# Patient Record
Sex: Female | Born: 1965 | Race: White | Hispanic: No | Marital: Married | Smoking: Former smoker
Health system: Southern US, Community
[De-identification: ages and names within clinical notes are randomized; demographics above are authoritative.]

## PROBLEM LIST (undated history)

## (undated) DIAGNOSIS — T7840XA Allergy, unspecified, initial encounter: Secondary | ICD-10-CM

## (undated) DIAGNOSIS — J45909 Unspecified asthma, uncomplicated: Secondary | ICD-10-CM

## (undated) DIAGNOSIS — Z9289 Personal history of other medical treatment: Secondary | ICD-10-CM

## (undated) DIAGNOSIS — Z9889 Other specified postprocedural states: Secondary | ICD-10-CM

## (undated) HISTORY — DX: Personal history of other medical treatment: Z92.89

## (undated) HISTORY — DX: Other specified postprocedural states: Z98.890

## (undated) HISTORY — DX: Unspecified asthma, uncomplicated: J45.909

## (undated) HISTORY — DX: Allergy, unspecified, initial encounter: T78.40XA

---

## 2000-09-03 ENCOUNTER — Emergency Department (HOSPITAL_COMMUNITY): Admission: EM | Admit: 2000-09-03 | Discharge: 2000-09-03 | Payer: Self-pay | Admitting: Internal Medicine

## 2012-01-31 ENCOUNTER — Other Ambulatory Visit: Payer: Self-pay | Admitting: Internal Medicine

## 2012-03-04 ENCOUNTER — Other Ambulatory Visit: Payer: Self-pay | Admitting: Physician Assistant

## 2012-04-22 ENCOUNTER — Other Ambulatory Visit: Payer: Self-pay | Admitting: Physician Assistant

## 2012-05-15 ENCOUNTER — Other Ambulatory Visit: Payer: Self-pay | Admitting: Physician Assistant

## 2012-05-17 ENCOUNTER — Ambulatory Visit: Payer: Managed Care, Other (non HMO) | Admitting: Emergency Medicine

## 2012-05-17 VITALS — BP 133/76 | HR 85 | Temp 98.3°F | Resp 18 | Ht 63.0 in | Wt 174.0 lb

## 2012-05-17 DIAGNOSIS — J45909 Unspecified asthma, uncomplicated: Secondary | ICD-10-CM

## 2012-05-17 MED ORDER — ALBUTEROL SULFATE HFA 108 (90 BASE) MCG/ACT IN AERS: 2.0000 | INHALATION_SPRAY | RESPIRATORY_TRACT | Status: DC | PRN

## 2012-05-17 MED ORDER — MOMETASONE FURO-FORMOTEROL FUM 200-5 MCG/ACT IN AERO: 2.0000 | INHALATION_SPRAY | Freq: Two times a day (BID) | RESPIRATORY_TRACT | Status: DC

## 2012-05-17 NOTE — Progress Notes (Signed)
     Patient Name: Sherry Wheeler Date of Birth: 1965-12-30 Medical Record Number: [id number] Gender: female Date of Encounter: 05/17/2012  Chief Complaint: Asthma   History of Present Illness:  Sherry Wheeler is a 46 y.o. very pleasant female patient who presents with the following:  History of asthma currrently under control.  Out of MDI's no complaints  There is no problem list on file for this patient.  Past Medical History  Diagnosis Date  . Allergy   . Asthma    Past Surgical History  Procedure Date  . Cesarean section    History  Substance Use Topics  . Smoking status: Never Smoker   . Smokeless tobacco: Not on file  . Alcohol Use: Yes     social   Family History  Problem Relation Age of Onset  . Hypertension Father   . Diabetes Paternal Grandmother   . Heart disease Paternal Grandmother   . Cancer Paternal Grandfather    Allergies  Allergen Reactions  . Zoloft (Sertraline Hcl) Rash    Medication list has been reviewed and updated.  Current Outpatient Prescriptions on File Prior to Visit  Medication Sig Dispense Refill  . VENTOLIN HFA 108 (90 BASE) MCG/ACT inhaler INHALE TWO PUFFS EVERY 4 HOURS AS NEEDED FOR SHORTNESS OF BREATH. NEED OFFICE VISIT  18 g  0    Review of Systems:  As per HPI, otherwise negative.    Physical Examination: Filed Vitals:   05/17/12 1310  BP: 133/76  Pulse: 85  Temp: 98.3 F (36.8 C)  Resp: 18   Filed Vitals:   05/17/12 1310  Height: 5\' 3"  (1.6 m)  Weight: 174 lb (78.926 kg)   Body mass index is 30.82 kg/(m^2). Ideal Body Weight: Weight in (lb) to have BMI = 25: 140.8    GEN: WDWN, NAD, Non-toxic, Alert & Oriented x 3 HEENT: Atraumatic, Normocephalic.  Ears and Nose: No external deformity. EXTR: No clubbing/cyanosis/edema NEURO: Normal gait.  PSYCH: Normally interactive. Conversant. Not depressed or anxious appearing.  Calm demeanor.    EKG / Labs / Xrays: None available at time of  encounter  Assessment and Plan: Asthma Refill MDI   Carmelina Dane, MD

## 2012-09-04 ENCOUNTER — Ambulatory Visit (INDEPENDENT_AMBULATORY_CARE_PROVIDER_SITE_OTHER): Payer: Managed Care, Other (non HMO) | Admitting: Physician Assistant

## 2012-09-04 VITALS — BP 132/86 | HR 101 | Temp 99.2°F | Resp 20 | Ht 62.0 in | Wt 179.0 lb

## 2012-09-04 DIAGNOSIS — N898 Other specified noninflammatory disorders of vagina: Secondary | ICD-10-CM

## 2012-09-04 DIAGNOSIS — J45909 Unspecified asthma, uncomplicated: Secondary | ICD-10-CM | POA: Insufficient documentation

## 2012-09-04 DIAGNOSIS — L293 Anogenital pruritus, unspecified: Secondary | ICD-10-CM

## 2012-09-04 DIAGNOSIS — J309 Allergic rhinitis, unspecified: Secondary | ICD-10-CM | POA: Insufficient documentation

## 2012-09-04 LAB — POCT WET PREP WITH KOH: KOH Prep POC: NEGATIVE

## 2012-09-04 MED ORDER — METRONIDAZOLE 500 MG PO TABS: 500.0000 mg | ORAL_TABLET | Freq: Two times a day (BID) | ORAL | Status: DC

## 2012-09-04 NOTE — Patient Instructions (Signed)
Use Claritin or Zyrtec each day for itching.  You may supplement with Benadryl if needed at bedtime.

## 2012-09-04 NOTE — Progress Notes (Signed)
Subjective:    Patient ID: Sherry Wheeler, female    DOB: [birth year], 46 y.o.   MRN: [id number]  HPI This 46 y.o. female presents for evaluation of vaginal irritation x 7-10 days.  Doesn't feel like previous UTI.  Itching and burning.  No change in soaps, etc.  No unusual vaginal discharge.  No urinary symptoms.  No fever, chills.  No nausea, vomiting or diarrhea. Not sexually active.  Review of Systems As above.  Past Medical History  Diagnosis Date  . Allergy   . Asthma     Past Surgical History  Procedure Date  . Cesarean section     Prior to Admission medications   Medication Sig Start Date End Date Taking? Authorizing Provider  albuterol (VENTOLIN HFA) 108 (90 BASE) MCG/ACT inhaler Inhale 2 puffs into the lungs every 4 (four) hours as needed for wheezing. 05/17/12  Yes Phillips Odor, MD  Mometasone Furo-Formoterol Fum 200-5 MCG/ACT AERO Inhale 2 puffs into the lungs 2 (two) times daily. 05/17/12  Yes Phillips Odor, MD    Allergies  Allergen Reactions  . Zoloft (Sertraline Hcl) Rash    History   Social History  . Marital Status: Married    Spouse Name: Rich    Number of Children: 1  . Years of Education: 14+   Occupational History  . waitress     [employer]   Social History Main Topics  . Smoking status: Current Every Day Smoker -- 1.0 packs/day    Types: Cigarettes  . Smokeless tobacco: Never Used  . Alcohol Use: Yes     social-rare  . Drug Use: No  . Sexually Active: Not Currently   Other Topics Concern  . Not on file   Social History Narrative  . No narrative on file    Family History  Problem Relation Age of Onset  . Hypertension Father   . Diabetes Paternal Grandmother   . Heart disease Paternal Grandmother   . Cancer Paternal Grandfather        Objective:   Physical Exam  Vitals reviewed. Constitutional: She is oriented to person, place, and time. She appears well-developed and well-nourished. No distress.  Eyes: Conjunctivae  normal are normal.  Pulmonary/Chest: Effort normal.  Genitourinary: Uterus normal. Rectal exam shows no external hemorrhoid and no fissure. Pelvic exam was performed with patient supine. No labial fusion. There is no rash, tenderness, lesion or injury on the right labia. There is no rash, tenderness, lesion or injury on the left labia. Cervix exhibits no motion tenderness, no discharge and no friability. Right adnexum displays no mass, no tenderness and no fullness. Left adnexum displays no mass, no tenderness and no fullness. No erythema, tenderness or bleeding around the vagina. No foreign body around the vagina. No signs of injury around the vagina. Vaginal discharge found.  Neurological: She is alert and oriented to person, place, and time.  Skin: Skin is warm and dry.  Psychiatric: She has a normal mood and affect.   Results for orders placed in visit on 09/04/12  POCT WET PREP WITH KOH      Component Value Range   Trichomonas, UA Negative     Clue Cells Wet Prep HPF POC 2-3     Epithelial Wet Prep HPF POC 0-1     Yeast Wet Prep HPF POC neg     Bacteria Wet Prep HPF POC 5+     RBC Wet Prep HPF POC 4-6     WBC Wet Prep HPF  POC 0-1     KOH Prep POC Negative        Assessment & Plan:   1. Leukorrhea, not specified as infective  POCT Wet Prep with KOH, metroNIDAZOLE (FLAGYL) 500 MG tablet  2. Vaginal itching

## 2013-03-13 ENCOUNTER — Other Ambulatory Visit: Payer: Self-pay | Admitting: Emergency Medicine

## 2013-04-27 ENCOUNTER — Other Ambulatory Visit: Payer: Self-pay

## 2013-04-27 MED ORDER — ALBUTEROL SULFATE HFA 108 (90 BASE) MCG/ACT IN AERS: 2.0000 | INHALATION_SPRAY | RESPIRATORY_TRACT | Status: DC | PRN

## 2013-06-03 ENCOUNTER — Telehealth: Payer: Self-pay

## 2013-06-03 NOTE — Telephone Encounter (Signed)
Patient called needing refill on all meds. Please let pt know when done [phone]

## 2013-06-03 NOTE — Telephone Encounter (Signed)
She will need to come in 

## 2013-06-04 MED ORDER — MOMETASONE FURO-FORMOTEROL FUM 200-5 MCG/ACT IN AERO: 2.0000 | INHALATION_SPRAY | Freq: Two times a day (BID) | RESPIRATORY_TRACT | Status: DC

## 2013-06-04 MED ORDER — ALBUTEROL SULFATE HFA 108 (90 BASE) MCG/ACT IN AERS: 2.0000 | INHALATION_SPRAY | RESPIRATORY_TRACT | Status: DC | PRN

## 2013-06-04 NOTE — Telephone Encounter (Signed)
Called her to advise. She wants Dulera and Ventolin inhaler, please advise. She states she can come in Monday. Would like Rx before then,

## 2013-06-04 NOTE — Telephone Encounter (Signed)
Sent to pharmacy.  Follow up as planned.  No further RFs until OV

## 2013-06-04 NOTE — Telephone Encounter (Signed)
Thanks patient advised.  

## 2013-06-11 ENCOUNTER — Other Ambulatory Visit: Payer: Self-pay | Admitting: Radiology

## 2013-06-21 ENCOUNTER — Ambulatory Visit (INDEPENDENT_AMBULATORY_CARE_PROVIDER_SITE_OTHER): Payer: Managed Care, Other (non HMO) | Admitting: Family Medicine

## 2013-06-21 VITALS — BP 142/84 | HR 79 | Temp 98.4°F | Resp 18 | Ht 62.5 in | Wt 190.0 lb

## 2013-06-21 DIAGNOSIS — J45909 Unspecified asthma, uncomplicated: Secondary | ICD-10-CM

## 2013-06-21 DIAGNOSIS — Z720 Tobacco use: Secondary | ICD-10-CM

## 2013-06-21 DIAGNOSIS — F17200 Nicotine dependence, unspecified, uncomplicated: Secondary | ICD-10-CM

## 2013-06-21 DIAGNOSIS — J4540 Moderate persistent asthma, uncomplicated: Secondary | ICD-10-CM

## 2013-06-21 MED ORDER — MONTELUKAST SODIUM 10 MG PO TABS: 10.0000 mg | ORAL_TABLET | Freq: Every day | ORAL | Status: DC

## 2013-06-21 MED ORDER — FLUTICASONE-SALMETEROL 100-50 MCG/DOSE IN AEPB: 1.0000 | INHALATION_SPRAY | Freq: Two times a day (BID) | RESPIRATORY_TRACT | Status: DC

## 2013-06-21 MED ORDER — ALBUTEROL SULFATE HFA 108 (90 BASE) MCG/ACT IN AERS: 2.0000 | INHALATION_SPRAY | RESPIRATORY_TRACT | Status: DC | PRN

## 2013-06-21 NOTE — Patient Instructions (Addendum)
PLEASE think hard about quitting smoking.  Your asthma will almost certainly get a lot better if you quit.  Right now your peak flows show that you are not under good control.  Start using the advair twice a day- please let me know if not helpful.  Please give me a call with an update in about 2 weeks

## 2013-06-21 NOTE — Progress Notes (Signed)
[facility] [address] [place] [address] [phone]  Date:  06/21/2013   Name:  JAZZMYN FILION   DOB:  1966-09-02   MRN:  [id number]  PCP:  Tally Due, MD    Chief Complaint: Asthma   History of Present Illness:  REANA CHACKO is a 47 y.o. very pleasant female patient who presents with the following:  She needs a refill of her asthma medications. Last seen here for asthma about one year ago She uses albuterol, and has been using dulera most recently.  However, she did better with advair in the past and would like to change back.  She needs her albuterol sometimes every 4 hours- this has been the case for several months.   She is not sure how long she has been on the dulera.  She has been on sigulair in the past as well and did ok with it.   She has been in the ED, but never admitted with her asthma. Some cough.   She is a smoker- currently smokes about 1 PPD.  She would like to quit but is not ready now because she is afraid she will gain weight.    Peak flow = 250, predicted 500 LMP about 2 months ago.  She is sometimes sexually active but "not often" Used albuterol and went to 275  Patient Active Problem List   Diagnosis Date Noted  . Asthma 09/04/2012  . AR (allergic rhinitis) 09/04/2012    Past Medical History  Diagnosis Date  . Allergy   . Asthma     Past Surgical History  Procedure Laterality Date  . Cesarean section      History  Substance Use Topics  . Smoking status: Current Every Day Smoker -- 1.00 packs/day    Types: Cigarettes  . Smokeless tobacco: Never Used  . Alcohol Use: Yes     Comment: social-rare    Family History  Problem Relation Age of Onset  . Hypertension Father   . Diabetes Paternal Grandmother   . Heart disease Paternal Grandmother   . Cancer Paternal Grandfather     Allergies  Allergen Reactions  . Zoloft (Sertraline Hcl) Rash    Medication list has been reviewed and  updated.  Current Outpatient Prescriptions on File Prior to Visit  Medication Sig Dispense Refill  . albuterol (VENTOLIN HFA) 108 (90 BASE) MCG/ACT inhaler Inhale 2 puffs into the lungs every 4 (four) hours as needed for wheezing. PATIENT NEEDS OFFICE VISIT FOR ADDITIONAL REFILLS  18 each  0  . mometasone-formoterol (DULERA) 200-5 MCG/ACT AERO Inhale 2 puffs into the lungs 2 (two) times daily.  1 Inhaler  0  . metroNIDAZOLE (FLAGYL) 500 MG tablet Take 1 tablet (500 mg total) by mouth 2 (two) times daily with a meal. DO NOT CONSUME ALCOHOL WHILE TAKING THIS MEDICATION.  14 tablet  0   No current facility-administered medications on file prior to visit.    Review of Systems:  As per HPI- otherwise negative.   Physical Examination: Filed Vitals:   06/21/13 1447  BP: 142/84  Pulse: 79  Temp: 98.4 F (36.9 C)  Resp: 18   Filed Vitals:   06/21/13 1447  Height: 5' 2.5" (1.588 m)  Weight: 190 lb (86.183 kg)   Body mass index is 34.18 kg/(m^2). Ideal Body Weight: Weight in (lb) to have BMI = 25: 138.6  GEN: WDWN, NAD, Non-toxic, A & O x 3, overweight HEENT: Atraumatic, Normocephalic. Neck  supple. No masses, No LAD.  Bilateral TM wnl, oropharynx normal.  PEERL,EOMI.   Ears and Nose: No external deformity. CV: RRR, No M/G/R. No JVD. No thrill. No extra heart sounds. PULM: CTA B, no wheezes, crackles, rhonchi. No retractions. No resp. distress. No accessory muscle use. ABD: S, NT, ND, +BS. No rebound. No HSM. EXTR: No c/c/e NEURO Normal gait.  PSYCH: Normally interactive. Conversant. Not depressed or anxious appearing.  Calm demeanor.    Assessment and Plan: Asthma, chronic, moderate persistent, uncomplicated - Plan: albuterol (VENTOLIN HFA) 108 (90 BASE) MCG/ACT inhaler, Fluticasone-Salmeterol (ADVAIR) 100-50 MCG/DOSE AEPB, montelukast (SINGULAIR) 10 MG tablet  Tobacco abuse  Changed to advair since dulera has not seemed to help her as much.  Also gave a paper rx for singulair-  she will need to see how much this costs prior to filling it.   Recommended that we do a urine pregnancy test to ensure she is not pregnant. Pt refuses to do HCG test, states that she knows she is not pregnant as she has had a period since her last act of intercourse.   See patient instructions for more details.    Signed Abbe Amsterdam, MD

## 2013-07-06 ENCOUNTER — Telehealth: Payer: Self-pay

## 2013-07-06 NOTE — Telephone Encounter (Signed)
Dr.Copland, Pt is calling to let you know that the advair has improved her asthma and she is now only having to use her rescue inhaler twice a day opposed to every four hours like she used to. Best# [phone]

## 2013-07-08 NOTE — Telephone Encounter (Signed)
Called and LMOM.  thanks for the update.  This is better, but we want her using her albuterol even less.  Please continue to working on quitting smoking.  Please come and see me in September- we may need to increase her adviar dosage for better control

## 2013-08-02 ENCOUNTER — Telehealth: Payer: Self-pay

## 2013-08-02 DIAGNOSIS — J4540 Moderate persistent asthma, uncomplicated: Secondary | ICD-10-CM

## 2013-08-02 MED ORDER — FLUTICASONE-SALMETEROL 250-50 MCG/DOSE IN AEPB: 1.0000 | INHALATION_SPRAY | Freq: Two times a day (BID) | RESPIRATORY_TRACT | Status: DC

## 2013-08-02 NOTE — Telephone Encounter (Signed)
PT STATES THE DR HAD UP HER DOSAGE OF ADVAIR AND NOW SHE IS OUT OF HER MEDICINE. PLEASE CALL [phone]    [facility] [address]

## 2013-08-02 NOTE — Telephone Encounter (Signed)
Please call this patient.  Looks like there was some miscommunication.    When she was seen 06/21/2013, she was told to come back in 2 weeks for re-check.  She called on 07/06/13 and said she was doing better, and Dr. Patsy Lager said to come back in so we could increase the Advair dosage.  Advair is not a medication that should be increased from 1 puff twice daily to 2 puffs twice daily, because it is a combination of 2 medications.  When we increase the dose, we give a new prescription, to increase the dose of one of the components, but not both.  Please advise the patient to RTC.  Since she's out, I'm sending in 1 diskus.  She needs to come in before she runs out.

## 2013-08-02 NOTE — Telephone Encounter (Signed)
Patient advised.

## 2013-08-03 ENCOUNTER — Telehealth: Payer: Self-pay

## 2013-08-03 NOTE — Telephone Encounter (Signed)
Dr Patsy Lager, pharm faxed req for clarification as to sig for pt's Advair. The current sig is inhale 1 dose into the lungs BID, but pt reported to them that she was told to use more than BID. Please advise and send in new Rx w/correct dose if it needs to be increased.

## 2013-08-03 NOTE — Telephone Encounter (Signed)
Called and [facility] for pt to clarify.  The strength of her advair has been increased so she only needs to use it one puff BID

## 2013-08-30 ENCOUNTER — Ambulatory Visit: Payer: Managed Care, Other (non HMO) | Admitting: Family Medicine

## 2013-08-30 VITALS — BP 152/96 | HR 87 | Temp 98.6°F | Resp 16 | Ht 62.0 in | Wt 184.0 lb

## 2013-08-30 DIAGNOSIS — F17200 Nicotine dependence, unspecified, uncomplicated: Secondary | ICD-10-CM

## 2013-08-30 DIAGNOSIS — J4540 Moderate persistent asthma, uncomplicated: Secondary | ICD-10-CM

## 2013-08-30 DIAGNOSIS — J45909 Unspecified asthma, uncomplicated: Secondary | ICD-10-CM

## 2013-08-30 MED ORDER — ALBUTEROL SULFATE HFA 108 (90 BASE) MCG/ACT IN AERS: 2.0000 | INHALATION_SPRAY | RESPIRATORY_TRACT | Status: DC | PRN

## 2013-08-30 MED ORDER — FLUTICASONE PROPIONATE 50 MCG/ACT NA SUSP: 2.0000 | Freq: Every day | NASAL | Status: DC

## 2013-08-30 MED ORDER — MONTELUKAST SODIUM 10 MG PO TABS: 10.0000 mg | ORAL_TABLET | Freq: Every day | ORAL | Status: DC

## 2013-08-30 MED ORDER — CETIRIZINE HCL 10 MG PO TABS: 10.0000 mg | ORAL_TABLET | Freq: Every day | ORAL | Status: DC

## 2013-08-30 MED ORDER — FLUTICASONE-SALMETEROL 250-50 MCG/DOSE IN AEPB: 1.0000 | INHALATION_SPRAY | Freq: Two times a day (BID) | RESPIRATORY_TRACT | Status: DC

## 2013-08-30 NOTE — Patient Instructions (Addendum)
Your ideal or goal peak flow should be 460. Last time you were in, when you were on the [facility] you were at 250 and up to 275 after albuterol.  Today on the Advair you are at.  After allergy season resolves and you are at your baseline, it may be worth coming in for full spirometry testing to make sure your lungs are functioning at the best possible level.  Allergic Rhinitis Allergic rhinitis is when the mucous membranes in the nose respond to allergens. Allergens are particles in the air that cause your body to have an allergic reaction. This causes you to release allergic antibodies. Through a chain of events, these eventually cause you to release histamine into the blood stream (hence the use of antihistamines). Although meant to be protective to the body, it is this release that causes your discomfort, such as frequent sneezing, congestion and an itchy runny nose.  CAUSES  The pollen allergens may come from grasses, trees, and weeds. This is seasonal allergic rhinitis, or "hay fever." Other allergens cause year-round allergic rhinitis (perennial allergic rhinitis) such as house dust mite allergen, pet dander and mold spores.  SYMPTOMS   Nasal stuffiness (congestion).  Runny, itchy nose with sneezing and tearing of the eyes.  There is often an itching of the mouth, eyes and ears. It cannot be cured, but it can be controlled with medications. DIAGNOSIS  If you are unable to determine the offending allergen, skin or blood testing may find it. TREATMENT   Avoid the allergen.  Medications and allergy shots (immunotherapy) can help.  Hay fever may often be treated with antihistamines in pill or nasal spray forms. Antihistamines block the effects of histamine. There are over-the-counter medicines that may help with nasal congestion and swelling around the eyes. Check with your caregiver before taking or giving this medicine. If the treatment above does not work, there are many new medications your  caregiver can prescribe. Stronger medications may be used if initial measures are ineffective. Desensitizing injections can be used if medications and avoidance fails. Desensitization is when a patient is given ongoing shots until the body becomes less sensitive to the allergen. Make sure you follow up with your caregiver if problems continue. SEEK MEDICAL CARE IF:   You develop fever (more than 100.5 F (38.1 C).  You develop a cough that does not stop easily (persistent).  You have shortness of breath.  You start wheezing.  Symptoms interfere with normal daily activities. Document Released: 07/30/2001 Document Revised: 01/27/2012 Document Reviewed: 02/08/2009 [facility] Patient Information 2014 [place], [place].  Hay Fever Hay fever is an allergic reaction to particles in the air. It cannot be passed from person to person. It cannot be cured, but it can be controlled. CAUSES  Hay fever is caused by something that triggers an allergic reaction (allergens). The following are examples of allergens:  Ragweed.  Feathers.  Animal dander.  Grass and tree pollens.  Cigarette smoke.  House dust.  Pollution. SYMPTOMS   Sneezing.  Runny or stuffy nose.  Tearing eyes.  Itchy eyes, nose, mouth, throat, skin, or other area.  Sore throat.  Headache.  Decreased sense of smell or taste. DIAGNOSIS Your caregiver will perform a physical exam and ask questions about the symptoms you are having.Allergy testing may be done to determine exactly what triggers your hay fever.  TREATMENT   Over-the-counter medicines may help symptoms. These include:  Antihistamines.  Decongestants. These may help with nasal congestion.  Your caregiver may prescribe  medicines if over-the-counter medicines do not work.  Some people benefit from allergy shots when other medicines are not helpful. HOME CARE INSTRUCTIONS   Avoid the allergen that is causing your symptoms, if possible.  Take all  medicine as told by your caregiver. SEEK MEDICAL CARE IF:   You have severe allergy symptoms and your current medicines are not helping.  Your treatment was working at one time, but you are now experiencing symptoms.  You have sinus congestion and pressure.  You develop a fever or headache.  You have thick nasal discharge.  You have asthma and have a worsening cough and wheezing. SEEK IMMEDIATE MEDICAL CARE IF:   You have swelling of your tongue or lips.  You have trouble breathing.  You feel lightheaded or like you are going to faint.  You have cold sweats.  You have a fever. Document Released: 11/04/2005 Document Revised: 01/27/2012 Document Reviewed: 01/30/2011 [facility] Patient Information 2014 [place], [place]. Smoking Cessation, Tips for Success YOU CAN QUIT SMOKING If you are ready to quit smoking, congratulations! You have chosen to help yourself be healthier. Cigarettes bring nicotine, tar, carbon monoxide, and other irritants into your body. Your lungs, heart, and blood vessels will be able to work better without these poisons. There are many different ways to quit smoking. Nicotine gum, nicotine patches, a nicotine inhaler, or nicotine nasal spray can help with physical craving. Hypnosis, support groups, and medicines help break the habit of smoking. Here are some tips to help you quit for good.  Throw away all cigarettes.  Clean and remove all ashtrays from your home, work, and car.  On a card, write down your reasons for quitting. Carry the card with you and read it when you get the urge to smoke.  Cleanse your body of nicotine. Drink enough water and fluids to keep your urine clear or pale yellow. Do this after quitting to flush the nicotine from your body.  Learn to predict your moods. Do not let a bad situation be your excuse to have a cigarette. Some situations in your life might tempt you into wanting a cigarette.  Never have "just one" cigarette. It leads to  wanting another and another. Remind yourself of your decision to quit.  Change habits associated with smoking. If you smoked while driving or when feeling stressed, try other activities to replace smoking. Stand up when drinking your coffee. Brush your teeth after eating. Sit in a different chair when you read the paper. Avoid alcohol while trying to quit, and try to drink fewer caffeinated beverages. Alcohol and caffeine may urge you to smoke.  Avoid foods and drinks that can trigger a desire to smoke, such as sugary or spicy foods and alcohol.  Ask people who smoke not to smoke around you.  Have something planned to do right after eating or having a cup of coffee. Take a walk or exercise to perk you up. This will help to keep you from overeating.  Try a relaxation exercise to calm you down and decrease your stress. Remember, you may be tense and nervous for the first 2 weeks after you quit, but this will pass.  Find new activities to keep your hands busy. Play with a pen, coin, or rubber band. Doodle or draw things on paper.  Brush your teeth right after eating. This will help cut down on the craving for the taste of tobacco after meals. You can try mouthwash, too.  Use oral substitutes, such as lemon drops,  carrots, a cinnamon stick, or chewing gum, in place of cigarettes. Keep them handy so they are available when you have the urge to smoke.  When you have the urge to smoke, try deep breathing.  Designate your home as a nonsmoking area.  If you are a heavy smoker, ask your caregiver about a prescription for nicotine chewing gum. It can ease your withdrawal from nicotine.  Reward yourself. Set aside the cigarette money you save and buy yourself something nice.  Look for support from others. Join a support group or smoking cessation program. Ask someone at home or at work to help you with your plan to quit smoking.  Always ask yourself, "Do I need this cigarette or is this just a  reflex?" Tell yourself, "Today, I choose not to smoke," or "I do not want to smoke." You are reminding yourself of your decision to quit, even if you do smoke a cigarette. HOW WILL I FEEL WHEN I QUIT SMOKING?  The benefits of not smoking start within days of quitting.  You may have symptoms of withdrawal because your body is used to nicotine (the addictive substance in cigarettes). You may crave cigarettes, be irritable, feel very hungry, cough often, get headaches, or have difficulty concentrating.  The withdrawal symptoms are only temporary. They are strongest when you first quit but will go away within 10 to 14 days.  When withdrawal symptoms occur, stay in control. Think about your reasons for quitting. Remind yourself that these are signs that your body is healing and getting used to being without cigarettes.  Remember that withdrawal symptoms are easier to treat than the major diseases that smoking can cause.  Even after the withdrawal is over, expect periodic urges to smoke. However, these cravings are generally short-lived and will go away whether you smoke or not. Do not smoke!  If you relapse and smoke again, do not lose hope. Most smokers quit 3 times before they are successful.  If you relapse, do not give up! Plan ahead and think about what you will do the next time you get the urge to smoke. LIFE AS A NONSMOKER: MAKE IT FOR A MONTH, MAKE IT FOR LIFE Day 1: Hang this page where you will see it every day. Day 2: Get rid of all ashtrays, matches, and lighters. Day 3: Drink water. Breathe deeply between sips. Day 4: Avoid places with smoke-filled air, such as bars, clubs, or the smoking section of restaurants. Day 5: Keep track of how much money you save by not smoking. Day 6: Avoid boredom. Keep a good book with you or go to the movies. Day 7: Reward yourself! One week without smoking! Day 8: Make a dental appointment to get your teeth cleaned. Day 9: Decide how you will turn  down a cigarette before it is offered to you. Day 10: Review your reasons for quitting. Day 11: Distract yourself. Stay active to keep your mind off smoking and to relieve tension. Take a walk, exercise, read a book, do a crossword puzzle, or try a new hobby. Day 12: Exercise. Get off the bus before your stop or use stairs instead of escalators. Day 13: Call on friends for support and encouragement. Day 14: Reward yourself! Two weeks without smoking! Day 15: Practice deep breathing exercises. Day 16: Bet a friend that you can stay a nonsmoker. Day 17: Ask to sit in nonsmoking sections of restaurants. Day 18: Hang up "No Smoking" signs. Day 19: Think of yourself as a  nonsmoker. Day 20: Each morning, tell yourself you will not smoke. Day 21: Reward yourself! Three weeks without smoking! Day 22: Think of smoking in negative ways. Remember how it stains your teeth, gives you bad breath, and leaves you short of breath. Day 23: Eat a nutritious breakfast. Day 24:Do not relive your days as a smoker. Day 25: Hold a pencil in your hand when talking on the telephone. Day 26: Tell all your friends you do not smoke. Day 27: Think about how much better food tastes. Day 28: Remember, one cigarette is one too many. Day 29: Take up a hobby that will keep your hands busy. Day 30: Congratulations! One month without smoking! Give yourself a big reward. Your caregiver can direct you to community resources or hospitals for support, which may include:  Group support.  Education.  Hypnosis.  Subliminal therapy. Document Released: 08/02/2004 Document Revised: 01/27/2012 Document Reviewed: 08/21/2009 [facility] Patient Information 2014 [place], [place].

## 2013-08-30 NOTE — Progress Notes (Signed)
Subjective:  This chart was scribed for Sherry Sorenson, MD by Sherry Wheeler, ED scribe.  This patient was seen in room [facility] Room 9 and the patient's care was started at 3:38 PM.   Patient ID: Sherry Wheeler, female    DOB: [birth year], 47 y.o.   MRN: [id number]  Chief Complaint  Patient presents with   Follow-up    asthma    HPI  HPI Comments: Sherry Wheeler is a 47 y.o. female who present for a follow-up appointment in regard to her asthma medication.  Pt was seen on 06/21/13 by Dr. Patsy Wheeler requesting a refill for her asthma medication.  At that time she was using [facility] and was finding that she had to use her albuterol inhaler every 4 hours.  At that visit her peak flow was also found to be 250 and predicted at 500.  She was changed from [facility] to Advair and also had Singulair added.  She states that her asthma has improved significantly since then and presently she is using her albuterol inhaler 1x/day.  She states she generally becomes worse seasonally and she is hoping her symptoms will further improve after the end of autumn.  She does not take any other allergy medications.  Pt denies audible wheezing or waking up short of breath recently.  She denies recent ED visits.    Pt is a current one-pack-per-day smoker.  Pt also notes she has had some mild nasal congestion for the past 2 weeks which she attributes to a mild cold or allergies.  She has been taking Alka Seltzer Plus as needed.    Past Medical History  Diagnosis Date   Allergy    Asthma       Medication List       This list is accurate as of: 08/30/13  3:38 PM.  Always use your most recent med list.               albuterol 108 (90 BASE) MCG/ACT inhaler  Commonly known as:  VENTOLIN HFA  Inhale 2 puffs into the lungs every 4 (four) hours as needed for wheezing.     Fluticasone-Salmeterol 250-50 MCG/DOSE Aepb  Commonly known as:  ADVAIR  Inhale 1 puff into the lungs 2 (two) times daily.     metroNIDAZOLE  500 MG tablet  Commonly known as:  FLAGYL  Take 1 tablet (500 mg total) by mouth 2 (two) times daily with a meal. DO NOT CONSUME ALCOHOL WHILE TAKING THIS MEDICATION.     montelukast 10 MG tablet  Commonly known as:  SINGULAIR  Take 1 tablet (10 mg total) by mouth at bedtime.        Social History Main Topics   Smoking status: Current Every Day Smoker -- 1.00 packs/day    Types: Cigarettes   Smokeless tobacco: Never Used   Alcohol Use: Yes     Comment: social-rare   Drug Use: No   Sexual Activity: Not Currently    Allergies  Allergen Reactions   Zoloft [Sertraline Hcl] Rash     Review of Systems  Constitutional: Negative for fever, chills, diaphoresis, activity change, appetite change, fatigue and unexpected weight change.  HENT: Positive for congestion, postnasal drip and rhinorrhea. Negative for ear discharge, ear pain, sinus pressure and sore throat.   Respiratory: Positive for shortness of breath. Negative for apnea, cough and wheezing.   Allergic/Immunologic: Positive for environmental allergies.  Hematological: Negative for adenopathy.  Psychiatric/Behavioral: Negative for sleep disturbance.  BP 152/96   Pulse 87   Temp(Src) 98.6 F (37 C) (Oral)   Resp 16   Ht 5\' 2"  (1.575 m)   Wt 184 lb (83.462 kg)   BMI 33.65 kg/m2   SpO2 98%   LMP 07/20/2013   Objective:   Physical Exam  Nursing note and vitals reviewed. Constitutional: She is oriented to person, place, and time. She appears well-developed and well-nourished. No distress.  HENT:  Head: Normocephalic and atraumatic.  Right Ear: Tympanic membrane and ear canal normal.  Left Ear: Tympanic membrane and ear canal normal.  Nose: Mucosal edema present. No rhinorrhea.  Mouth/Throat: Posterior oropharyngeal erythema present. No oropharyngeal exudate or posterior oropharyngeal edema.  Eyes: EOM are normal.  Neck: Neck supple. No tracheal deviation present.  Cardiovascular: Normal rate, regular rhythm, S1  normal and S2 normal.   Pulmonary/Chest: Effort normal and breath sounds normal. No respiratory distress. She has no decreased breath sounds. She has no wheezes. She has no rhonchi. She has no rales.  Musculoskeletal: Normal range of motion.  Lymphadenopathy:       Head (right side): No submental, no preauricular and no posterior auricular adenopathy present.       Head (left side): No submental, no preauricular and no posterior auricular adenopathy present.    She has no cervical adenopathy.       Right: No supraclavicular adenopathy present.       Left: No supraclavicular adenopathy present.  Neurological: She is alert and oriented to person, place, and time.  Skin: Skin is warm and dry.  Psychiatric: She has a normal mood and affect. Her behavior is normal.     peak flow 310 - goal peak flow 467 Assessment & Plan:  Tobacco use disorder - Encouraged cessation - discussed wellbutrin but pt declines since did not help husband. Worried about weight gain with cessation.  Asthma, chronic, moderate persistent, uncomplicated - Plan: montelukast (SINGULAIR) 10 MG tablet, Fluticasone-Salmeterol (ADVAIR) 250-50 MCG/DOSE AEPB, albuterol (VENTOLIN HFA) 108 (90 BASE) MCG/ACT inhaler - Continue on current regimen as pt feels large amount of symptomatic improvement. However, peak flow is still sig below goal. Rec recheck NOT during allergy season as suspect allergic rhinitis may currently be making asthma worse. If peak flow continues to be sig below goal at f/u consider increasing advair to 500/50 and full spirometry.  Allergic rhinitis - start zyrtec and flonase  Meds ordered this encounter  Medications   montelukast (SINGULAIR) 10 MG tablet    Sig: Take 1 tablet (10 mg total) by mouth at bedtime.    Dispense:  90 tablet    Refill:  3   cetirizine (ZYRTEC) 10 MG tablet    Sig: Take 1 tablet (10 mg total) by mouth daily.    Dispense:  30 tablet    Refill:  11   fluticasone (FLONASE) 50 MCG/ACT  nasal spray    Sig: Place 2 sprays into the nose daily.    Dispense:  16 g    Refill:  6   Fluticasone-Salmeterol (ADVAIR) 250-50 MCG/DOSE AEPB    Sig: Inhale 1 puff into the lungs 2 (two) times daily.    Dispense:  60 each    Refill:  8    Order Specific Question:  Supervising Provider    Answer:  DOOLITTLE, ROBERT P [[id number]]   albuterol (VENTOLIN HFA) 108 (90 BASE) MCG/ACT inhaler    Sig: Inhale 2 puffs into the lungs every 4 (four) hours as needed for wheezing.  Dispense:  18 each    Refill:  11    Order Specific Question:  Supervising Provider    Answer:  Ethelda Chick [[id number]]  '

## 2014-06-03 ENCOUNTER — Ambulatory Visit (INDEPENDENT_AMBULATORY_CARE_PROVIDER_SITE_OTHER): Payer: Managed Care, Other (non HMO) | Admitting: Family Medicine

## 2014-06-03 VITALS — BP 128/88 | HR 93 | Temp 98.9°F | Resp 18 | Ht 62.0 in | Wt 185.0 lb

## 2014-06-03 DIAGNOSIS — IMO0002 Reserved for concepts with insufficient information to code with codable children: Secondary | ICD-10-CM

## 2014-06-03 DIAGNOSIS — B353 Tinea pedis: Secondary | ICD-10-CM

## 2014-06-03 DIAGNOSIS — F17200 Nicotine dependence, unspecified, uncomplicated: Secondary | ICD-10-CM

## 2014-06-03 DIAGNOSIS — J4540 Moderate persistent asthma, uncomplicated: Secondary | ICD-10-CM

## 2014-06-03 DIAGNOSIS — J45909 Unspecified asthma, uncomplicated: Secondary | ICD-10-CM

## 2014-06-03 DIAGNOSIS — L03012 Cellulitis of left finger: Secondary | ICD-10-CM

## 2014-06-03 MED ORDER — BACITRACIN 500 UNIT/GM EX OINT: 1.0000 "application " | TOPICAL_OINTMENT | Freq: Two times a day (BID) | CUTANEOUS | Status: DC

## 2014-06-03 MED ORDER — CLOTRIMAZOLE 1 % EX CREA: 1.0000 "application " | TOPICAL_CREAM | Freq: Two times a day (BID) | CUTANEOUS | Status: DC

## 2014-06-03 MED ORDER — FLUTICASONE-SALMETEROL 250-50 MCG/DOSE IN AEPB: 1.0000 | INHALATION_SPRAY | Freq: Two times a day (BID) | RESPIRATORY_TRACT | Status: DC

## 2014-06-03 NOTE — Progress Notes (Signed)
Subjective:    Patient ID: Sherry Wheeler, female    DOB: 1966-01-07, 48 y.o.   MRN: [id number]  HPI This is a very pleasant 48 year old female who presents today with several complaints. Patient reports today with left 3rd finger infection around her nail. She had a blister that burst and then it became red. She has been using peroxide and neosporin without improvement. It is mildly painful.   Has noticed athlete's foot at base of right great toe. This is peeling and itchy. Has used OTC antifungal cream for less than a week. No improvement. Had this problem in past and was given a RX medication which cleared it quickly. She can not recall the name.  Patient reports that her asthma is well controlled. Has used her albuterol inhaler 3-4 times in last month. This has correlated with forgetting to use advair at bedtime. She reports that she is not restricted in her activities due to her breathing, but feels that her weight makes it difficult to be as active as she would like.  Patient has stopped smoking cigarettes and is vaping. She has decreased her nicotine use in her vape. She uses a combination of 3 and 6 mg liquid.  [facility], is interested in establishing regular care at 104.    Review of Systems No fever, no chills, no cough, no wheezing, no SOB.    Objective:   Physical Exam  Vitals reviewed. Constitutional: She is oriented to person, place, and time. She appears well-developed and well-nourished.  HENT:  Head: Normocephalic and atraumatic.  Right Ear: External ear normal.  Left Ear: External ear normal.  Nose: Nose normal.  Mouth/Throat: Oropharynx is clear and moist.  Eyes: Conjunctivae are normal.  Neck: Normal range of motion. Neck supple.  Cardiovascular: Normal rate, regular rhythm and normal heart sounds.   Pulmonary/Chest: Effort normal and breath sounds normal. She has no wheezes.  Musculoskeletal: Normal range of motion.    Neurological: She is alert and oriented to person, place, and time.  Skin: Skin is warm and dry.  Left third fingernail base with erythema, moderate swelling, small area intact scabbing. Nail secure. Unable to express drainage. No streaking.  Base of right great toe with approximately 1 cm area of peeling skin. No drainage.   Psychiatric: She has a normal mood and affect. Her behavior is normal. Judgment and thought content normal.    Peak flows- good effort, range of 300-400, below predicted 467, but improved from last visit.     Assessment & Plan:  1. Asthma, chronic, moderate persistent, uncomplicated - Spirometry: Peak - Fluticasone-Salmeterol (ADVAIR) 250-50 MCG/DOSE AEPB; Inhale 1 puff into the lungs 2 (two) times daily.  Dispense: 60 each; Refill: 8  2. Tobacco use disorder -discussed unknown risk factors of vaping and encouraged patient to continue to decrease nicotine with the goal of stopping  3. Tinea pedis of both feet - clotrimazole (LOTRIMIN) 1 % cream; Apply 1 application topically 2 (two) times daily. Apply until rash resolved and then for 5 more days  Dispense: 30 g; Refill: 0  4. Paronychia, left - offered to numb area and open to promote drainage and healing, patient declined. Will conservatively treat with topical antibiotic ointment and warm compresses. Patient understand that it may get worse. Patient leaving to go on vacation in 8 days; encouraged her to return to clinic for treatment if no improvement in 3 days. - bacitracin 500 UNIT/GM ointment; Apply 1  application topically 2 (two) times daily.  Dispense: 15 g; Refill: 0  -Follow up in 3 months at 104 for CPE/follow up of asthma  Emi Belfast, FNP-BC  [facility]  06/04/2014 1:39 PM

## 2014-06-04 ENCOUNTER — Encounter: Payer: Self-pay | Admitting: Family Medicine

## 2014-06-06 NOTE — Progress Notes (Signed)
7/20 tried to call patient but VM was full. Could not leave a message.

## 2014-06-09 ENCOUNTER — Telehealth: Payer: Self-pay | Admitting: Family Medicine

## 2014-06-09 NOTE — Telephone Encounter (Signed)
Pt is going out of town, she will call to set up an appt when she gets back

## 2014-09-19 ENCOUNTER — Encounter: Payer: Self-pay | Admitting: Family Medicine

## 2015-01-30 ENCOUNTER — Other Ambulatory Visit: Payer: Self-pay | Admitting: Family Medicine

## 2015-02-09 ENCOUNTER — Telehealth: Payer: Self-pay

## 2015-02-09 NOTE — Telephone Encounter (Signed)
Looked for coupons. We do not currently have any. Left VM informing pt we do not have coupons.

## 2015-02-09 NOTE — Telephone Encounter (Signed)
We treat the patient for asthma and she is wondering if we have any coupons for the advair inhaler.  [phone]

## 2015-03-06 ENCOUNTER — Ambulatory Visit (INDEPENDENT_AMBULATORY_CARE_PROVIDER_SITE_OTHER): Payer: Managed Care, Other (non HMO) | Admitting: Family Medicine

## 2015-03-06 VITALS — BP 128/80 | HR 90 | Temp 98.1°F | Resp 18 | Ht 62.0 in | Wt 161.0 lb

## 2015-03-06 DIAGNOSIS — L03221 Cellulitis of neck: Secondary | ICD-10-CM | POA: Diagnosis not present

## 2015-03-06 MED ORDER — CEPHALEXIN 500 MG PO CAPS: 500.0000 mg | ORAL_CAPSULE | Freq: Three times a day (TID) | ORAL | Status: DC

## 2015-03-06 NOTE — Patient Instructions (Signed)
I sent an rx for keflex antibiotic to your drug store.  Take this three times a day for 5- 7 days.   If your neck is not getting better or if you are getting worse please let me know or otherwise seek care!

## 2015-03-06 NOTE — Progress Notes (Signed)
[facility] [address], [place] [address] [phone]  Date:  03/06/2015   Name:  Sherry Wheeler   DOB:  12-15-1965   MRN:  [id number]  PCP:  Tally Due, MD    Chief Complaint: Mass   History of Present Illness:  Sherry Wheeler is a 49 y.o. very pleasant female patient who presents with the following:  Generally healthy except for asthma.   She noted a painful lump on the back left of her neck yesterday.  It burns, hurts to the touch. No other spots that she has noticed.   No fever, ow she feels well.  No cough LMP is current No ST or chills  Patient Active Problem List   Diagnosis Date Noted  . Asthma 09/04/2012  . AR (allergic rhinitis) 09/04/2012    Past Medical History  Diagnosis Date  . Allergy   . Asthma     Past Surgical History  Procedure Laterality Date  . Cesarean section      History  Substance Use Topics  . Smoking status: Current Every Day Smoker -- 1.00 packs/day    Types: Cigarettes  . Smokeless tobacco: Never Used  . Alcohol Use: Yes     Comment: social-rare    Family History  Problem Relation Age of Onset  . Hypertension Father   . Diabetes Paternal Grandmother   . Heart disease Paternal Grandmother   . Cancer Paternal Grandfather     Allergies  Allergen Reactions  . Zoloft [Sertraline Hcl] Rash    Medication list has been reviewed and updated.  Current Outpatient Prescriptions on File Prior to Visit  Medication Sig Dispense Refill  . albuterol (VENTOLIN HFA) 108 (90 BASE) MCG/ACT inhaler INHALE TWO PUFFS BY MOUTH EVERY 4 HOURS AS NEEDED FOR WHEEZING.  "OV NEEDED FOR ADDITIONAL REFILLS" 18 each 0  . bacitracin 500 UNIT/GM ointment Apply 1 application topically 2 (two) times daily. 15 g 0  . clotrimazole (LOTRIMIN) 1 % cream Apply 1 application topically 2 (two) times daily. Apply until rash resolved and then for 5 more days 30 g 0  . Fluticasone-Salmeterol (ADVAIR) 250-50 MCG/DOSE AEPB  Inhale 1 puff into the lungs 2 (two) times daily. 60 each 8  . montelukast (SINGULAIR) 10 MG tablet Take 1 tablet (10 mg total) by mouth at bedtime. (Patient not taking: Reported on 03/06/2015) 90 tablet 3   No current facility-administered medications on file prior to visit.    Review of Systems:  As per HPI- otherwise negative.   Physical Examination: Filed Vitals:   03/06/15 1524  BP: 128/80  Pulse: 90  Temp: 98.1 F (36.7 C)  Resp: 18   Filed Vitals:   03/06/15 1524  Height:  (1.575 m)  Weight: 161 lb (73.029 kg)   Body mass index is 29.44 kg/(m^2). Ideal Body Weight: Weight in (lb) to have BMI = 25: 136.4  GEN: WDWN, NAD, Non-toxic, A & O x 3, overweight, looks well HEENT: Atraumatic, Normocephalic. Neck supple. No masses, No LAD.  Bilateral TM wnl, oropharynx normal.  PEERL,EOMI.   On the back of her neck, and the hairline and below are several scattered discrete areas of irritation and induration, c/w mild cellulitis.  No fluctuance or evidence of abscess.   Lesions are bilateral so not shingles  No cervical, axillary or supraclavicular nodes Ears and Nose: No external deformity. CV: RRR, No M/G/R. No JVD. No thrill. No extra heart sounds. PULM: CTA B, no  wheezes, crackles, rhonchi. No retractions. No resp. distress. No accessory muscle use. EXTR: No c/c/e NEURO Normal gait.  PSYCH: Normally interactive. Conversant. Not depressed or anxious appearing.  Calm demeanor.    Assessment and Plan: Cellulitis of neck - Plan: cephALEXin (KEFLEX) 500 MG capsule  Likely mild cellulitis of the skin on her neck.  Treat with keflex as below. Asked her to let me know if not better soon-  Meds ordered this encounter  Medications  . cephALEXin (KEFLEX) 500 MG capsule    Sig: Take 1 capsule (500 mg total) by mouth 3 (three) times daily.    Dispense:  21 capsule    Refill:  0     Signed Abbe AmsterdamJessica Lakeysha Slutsky, MD

## 2015-06-05 ENCOUNTER — Other Ambulatory Visit: Payer: Self-pay | Admitting: Family Medicine

## 2015-07-21 ENCOUNTER — Other Ambulatory Visit: Payer: Self-pay | Admitting: Physician Assistant

## 2015-07-22 NOTE — Telephone Encounter (Signed)
Patient lost her rescue inhaler and is requesting a refill to be sent to [facility] on [address]. Patients call back number is [phone]

## 2015-07-31 ENCOUNTER — Ambulatory Visit (INDEPENDENT_AMBULATORY_CARE_PROVIDER_SITE_OTHER): Payer: Managed Care, Other (non HMO) | Admitting: Internal Medicine

## 2015-07-31 VITALS — BP 134/108 | HR 73 | Temp 98.5°F | Resp 16 | Ht 62.0 in | Wt 162.0 lb

## 2015-07-31 DIAGNOSIS — Z720 Tobacco use: Secondary | ICD-10-CM

## 2015-07-31 DIAGNOSIS — R030 Elevated blood-pressure reading, without diagnosis of hypertension: Secondary | ICD-10-CM

## 2015-07-31 DIAGNOSIS — F17200 Nicotine dependence, unspecified, uncomplicated: Secondary | ICD-10-CM | POA: Insufficient documentation

## 2015-07-31 DIAGNOSIS — J4540 Moderate persistent asthma, uncomplicated: Secondary | ICD-10-CM

## 2015-07-31 DIAGNOSIS — IMO0001 Reserved for inherently not codable concepts without codable children: Secondary | ICD-10-CM

## 2015-07-31 DIAGNOSIS — Z6829 Body mass index (BMI) 29.0-29.9, adult: Secondary | ICD-10-CM | POA: Insufficient documentation

## 2015-07-31 MED ORDER — FLUTICASONE-SALMETEROL 250-50 MCG/DOSE IN AEPB: INHALATION_SPRAY | RESPIRATORY_TRACT | Status: DC

## 2015-07-31 MED ORDER — ALBUTEROL SULFATE HFA 108 (90 BASE) MCG/ACT IN AERS: INHALATION_SPRAY | RESPIRATORY_TRACT | Status: DC

## 2015-07-31 NOTE — Progress Notes (Signed)
   Subjective:    Patient ID: Sherry Wheeler, female    DOB: July 11, 1966, 49 y.o.   MRN: [id number] This chart was scribed for Sherry Sia, MD by Jolene Provost, Medical Scribe. This patient was seen in Room 2 and the patient's care was started a 4:15 PM.  Chief Complaint  Patient presents with  . Follow-up    Asthma    HPI HPI Comments: [name] is a 49 y.o. female who presents to [facility] reporting for a medication refill for her asthma medication. She states her medication has effectively treated her asthma sx.  She agrees to be scheduled for a full physical since she is turning 49 in a few days. She has never had a colonoscopy. She has never had a mammogram. She has not had a pap smear in the last five years.  ?Td date  Says she has whitecoat HTN w/ neg fh of bp probs SMOKER!! Review of Systems  Constitutional: Negative for fever and chills.  Respiratory: Negative for chest tightness, shortness of breath and wheezing.   having allergic rhinitis for last few weeks asthm remains contr with daily advair     Objective:   Physical Exam  Constitutional: She is oriented to person, place, and time. She appears well-developed and well-nourished. No distress.  HENT:  Head: Normocephalic and atraumatic.  Eyes: Pupils are equal, round, and reactive to light.  Neck: Neck supple.  Cardiovascular: Normal rate.   Pulmonary/Chest: Effort normal. No respiratory distress. She has no wheezes.  Lungs were clear with no wheezes  Musculoskeletal: Normal range of motion.  Neurological: She is alert and oriented to person, place, and time. Coordination normal.  Skin: Skin is warm and dry. She is not diaphoretic.  Psychiatric: She has a normal mood and affect. Her behavior is normal.  Nursing note and vitals reviewed.   Filed Vitals:   07/31/15 1605 07/31/15 1611  BP: 160/100 134/108  Pulse: 73   Temp: 98.5 F (36.9 C)   TempSrc: Oral   Resp: 16   Height:  (1.575 m)     Weight: 162 lb (73.483 kg)   SpO2: 98%        Assessment & Plan:    Asthma, chronic, moderate persistent, uncomplicated--meds refilled  Elevated BP--home BP daily 2 weeks then f/u  Needs other f/u and says she gets care here usually: Smoker BMI 29.0-29.9,adult Needs PAP Needs tdap Needs further disc colonos and mammogr--never  Will set up cpe at 104 for her

## 2015-09-12 ENCOUNTER — Ambulatory Visit (INDEPENDENT_AMBULATORY_CARE_PROVIDER_SITE_OTHER): Payer: Managed Care, Other (non HMO) | Admitting: Family Medicine

## 2015-09-12 ENCOUNTER — Encounter: Payer: Self-pay | Admitting: Family Medicine

## 2015-09-12 VITALS — BP 139/84 | HR 91 | Temp 97.6°F | Resp 16 | Ht 62.5 in | Wt 166.6 lb

## 2015-09-12 DIAGNOSIS — R5383 Other fatigue: Secondary | ICD-10-CM

## 2015-09-12 DIAGNOSIS — Z124 Encounter for screening for malignant neoplasm of cervix: Secondary | ICD-10-CM

## 2015-09-12 DIAGNOSIS — Z1321 Encounter for screening for nutritional disorder: Secondary | ICD-10-CM

## 2015-09-12 DIAGNOSIS — Z1389 Encounter for screening for other disorder: Secondary | ICD-10-CM

## 2015-09-12 DIAGNOSIS — Z139 Encounter for screening, unspecified: Secondary | ICD-10-CM

## 2015-09-12 DIAGNOSIS — E669 Obesity, unspecified: Secondary | ICD-10-CM

## 2015-09-12 DIAGNOSIS — Z0000 Encounter for general adult medical examination without abnormal findings: Secondary | ICD-10-CM | POA: Diagnosis not present

## 2015-09-12 DIAGNOSIS — J4520 Mild intermittent asthma, uncomplicated: Secondary | ICD-10-CM | POA: Diagnosis not present

## 2015-09-12 DIAGNOSIS — Z1211 Encounter for screening for malignant neoplasm of colon: Secondary | ICD-10-CM | POA: Diagnosis not present

## 2015-09-12 DIAGNOSIS — Z1239 Encounter for other screening for malignant neoplasm of breast: Secondary | ICD-10-CM

## 2015-09-12 LAB — POCT URINALYSIS DIP (MANUAL ENTRY)
Bilirubin, UA: NEGATIVE
Glucose, UA: NEGATIVE
Nitrite, UA: NEGATIVE
PROTEIN UA: NEGATIVE
SPEC GRAV UA: 1.02
Urobilinogen, UA: 0.2
pH, UA: 6

## 2015-09-12 LAB — LIPID PANEL
CHOLESTEROL: 207 mg/dL — AB (ref 125–200)
HDL: 59 mg/dL (ref >=46)
LDL Cholesterol: 122 mg/dL (ref <130)
TRIGLYCERIDES: 128 mg/dL (ref <150)
Total CHOL/HDL Ratio: 3.5 Ratio (ref <=5.0)
VLDL: 26 mg/dL (ref <30)

## 2015-09-12 LAB — CBC
HCT: 40.6 % (ref 36.0–46.0)
Hemoglobin: 14 g/dL (ref 12.0–15.0)
MCH: 31.5 pg (ref 26.0–34.0)
MCHC: 34.5 g/dL (ref 30.0–36.0)
MCV: 91.4 fL (ref 78.0–100.0)
MPV: 9.2 fL (ref 8.6–12.4)
Platelets: 278 10*3/uL (ref 150–400)
RBC: 4.44 MIL/uL (ref 3.87–5.11)
RDW: 12.7 % (ref 11.5–15.5)
WBC: 7.2 10*3/uL (ref 4.0–10.5)

## 2015-09-12 LAB — TSH: TSH: 1.098 u[IU]/mL (ref 0.350–4.500)

## 2015-09-12 LAB — COMPREHENSIVE METABOLIC PANEL
ALBUMIN: 4.4 g/dL (ref 3.6–5.1)
ALT: 21 U/L (ref 6–29)
AST: 20 U/L (ref 10–35)
Alkaline Phosphatase: 62 U/L (ref 33–130)
BUN: 20 mg/dL (ref 7–25)
CALCIUM: 9.9 mg/dL (ref 8.6–10.4)
CHLORIDE: 106 mmol/L (ref 98–110)
CO2: 24 mmol/L (ref 20–31)
Creat: 0.89 mg/dL (ref 0.50–1.05)
Glucose, Bld: 86 mg/dL (ref 65–99)
Potassium: 4.2 mmol/L (ref 3.5–5.3)
SODIUM: 143 mmol/L (ref 135–146)
TOTAL PROTEIN: 7.3 g/dL (ref 6.1–8.1)
Total Bilirubin: 0.3 mg/dL (ref 0.2–1.2)

## 2015-09-12 LAB — HEMOGLOBIN A1C
HEMOGLOBIN A1C: 5.4 % (ref <5.7)
MEAN PLASMA GLUCOSE: 108 mg/dL (ref <117)

## 2015-09-12 NOTE — Progress Notes (Signed)
Subjective:    Patient ID: Sherry Wheeler, female    DOB: 1966/04/30, 49 y.o.   MRN: [id number]  HPI This is a 49 yo female who presents for CPE.  Last CPE- unknown Mammo- never  Pap- many years ago, LMP 11/15, occasional spotting. Colonoscopy- never Tdap- unknown, declines today Flu-declines Eye- regular Dental- regular Exercise- walks at work Vapes instead of cigarettes.   Past Medical History  Diagnosis Date  . Allergy   . Asthma    Past Surgical History  Procedure Laterality Date  . Cesarean section     Family History  Problem Relation Age of Onset  . Hypertension Father   . Diabetes Paternal Grandmother   . Heart disease Paternal Grandmother   . Cancer Paternal Grandfather    Social History  Substance Use Topics  . Smoking status: Former Smoker -- 1.00 packs/day    Types: Cigarettes  . Smokeless tobacco: Never Used  . Alcohol Use: 0.0 oz/week    0 Standard drinks or equivalent per week     Comment: social-rare    Review of Systems  Constitutional: Positive for diaphoresis and fatigue.  HENT: Negative.   Eyes: Negative.   Respiratory: Positive for chest tightness, shortness of breath and wheezing.        Using rescue inhaler every couple of days. Stable symptoms.   Cardiovascular: Negative.   Gastrointestinal: Positive for diarrhea (Has some with dairy foods. Maybe 1-2x per month. ).  Endocrine: Negative.   Genitourinary: Negative.   Musculoskeletal: Negative.   Skin: Negative.   Allergic/Immunologic: Positive for environmental allergies.  Neurological: Negative.   Hematological: Negative.   Psychiatric/Behavioral: Negative.        Objective:   Physical Exam Physical Exam  Constitutional: She is oriented to person, place, and time. She appears well-developed and well-nourished. No distress.  HENT:  Head: Normocephalic and atraumatic.  Right Ear: External ear normal.  Left Ear: External ear normal.  Nose: Nose normal.  Mouth/Throat:  Oropharynx is clear and moist. No oropharyngeal exudate.  Eyes: Conjunctivae are normal. Pupils are equal, round, and reactive to light.  Neck: Normal range of motion. Neck supple. No JVD present. No thyromegaly present.  Cardiovascular: Normal rate, regular rhythm, normal heart sounds and intact distal pulses.   Pulmonary/Chest: Effort normal and breath sounds normal. Right breast exhibits no inverted nipple, no mass, no nipple discharge, no skin change and no tenderness. Left breast exhibits no inverted nipple, no mass, no nipple discharge, no skin change and no tenderness. Breasts are symmetrical.  Abdominal: Soft. Bowel sounds are normal. She exhibits no distension and no mass. There is no tenderness. There is no rebound and no guarding.  Genitourinary: Vagina normal. Pelvic exam was performed with patient supine. There is no rash, tenderness, lesion or injury on the right labia. There is no rash, tenderness, lesion or injury on the left labia. Cervix exhibits no motion tenderness and no discharge. No vaginal discharge found.  Musculoskeletal: Normal range of motion. She exhibits no edema or tenderness.  Lymphadenopathy:    She has no cervical adenopathy.  Neurological: She is alert and oriented to person, place, and time. She has normal reflexes.  Skin: Skin is warm and dry. She is not diaphoretic.  Psychiatric: She has a normal mood and affect. Her behavior is normal. Judgment and thought content normal.  Vitals reviewed.  BP 139/84 mmHg  Pulse 91  Temp(Src) 97.6 F (36.4 C) (Oral)  Resp 16  Ht 5' 2.5" (1.588 m)  Wt 166 lb 9.6 oz (75.569 kg)  BMI 29.97 kg/m2  LMP 10/12/2014 Wt Readings from Last 3 Encounters:  09/12/15 166 lb 9.6 oz (75.569 kg)  07/31/15 162 lb (73.483 kg)  03/06/15 161 lb (73.029 kg)   Depression screen [facility] 2/9 09/12/2015 07/31/2015  Decreased Interest 0 0  Down, Depressed, Hopeless 0 0  PHQ - 2 Score 0 0      Assessment & Plan:  1. Annual physical  exam  2. Asthma, mild intermittent, uncomplicated - CBC  3. Obesity - encouraged increased activity, decreased sugars, starches, prepared/fast food - CBC - Comprehensive metabolic panel - Lipid panel - TSH - Hemoglobin A1c  4. Other fatigue - CBC - Comprehensive metabolic panel - Vit D  25 hydroxy (rtn osteoporosis monitoring)  5. Screening for colon cancer - Ambulatory referral to Gastroenterology  6. Screening for breast cancer - MM Digital Screening; Future  7. Encounter for vitamin deficiency screening - Vitamin D level  8. Screening for hematuria or proteinuria - POCT urinalysis dipstick  9. Screening for cervical cancer - Pap IG and Chlamydia/Gonococcus, NAA  - follow up in 6 months Olean Ree, FNP-BC  [facility], [facility]  09/15/2015 3:56 PM

## 2015-09-12 NOTE — Patient Instructions (Signed)

## 2015-09-13 ENCOUNTER — Other Ambulatory Visit: Payer: Self-pay

## 2015-09-13 DIAGNOSIS — Z1231 Encounter for screening mammogram for malignant neoplasm of breast: Secondary | ICD-10-CM

## 2015-09-13 LAB — VITAMIN D 25 HYDROXY (VIT D DEFICIENCY, FRACTURES): Vit D, 25-Hydroxy: 28 ng/mL — ABNORMAL LOW (ref 30–100)

## 2015-09-14 ENCOUNTER — Encounter: Payer: Self-pay | Admitting: Family Medicine

## 2015-09-14 LAB — PAP IG AND CT-NG NAA
Chlamydia Probe Amp: NEGATIVE
GC PROBE AMP: NEGATIVE

## 2016-02-12 ENCOUNTER — Telehealth: Payer: Self-pay

## 2016-02-12 NOTE — Telephone Encounter (Signed)
Pt states she is going out of town and would like to have a written prescription for VENTOLIN HFA 108 (90 BASE) and ADVAIR 250-50 just in case she may need it. Please call pt at [phone]

## 2016-02-13 ENCOUNTER — Telehealth: Payer: Self-pay

## 2016-02-13 NOTE — Telephone Encounter (Signed)
Pt called back// said she is at work... pls lv vm// explained that she can have the out of town pharm. Call in town [facility]. To req. Transfer...    Call Documentation      Cydney Okamara N Augustin, CMA at 02/13/2016 11:35 AM     Status: Signed       Expand All Collapse All   Left message for pt to call back.  She has refills until September. Advised on VM.            Red ChristiansMaudia W Baldwin at 02/12/2016 10:01 AM     Status: Signed       Expand All Collapse All   Pt states she is going out of town and would like to have a written prescription for VENTOLIN HFA 108 (90 BASE) and ADVAIR 250-50 just in case she may need it. Please call pt at [phone]

## 2016-02-13 NOTE — Telephone Encounter (Signed)
Left message for pt to call back.  She has refills until September. Advised on VM.

## 2016-02-15 MED ORDER — FLUTICASONE-SALMETEROL 250-50 MCG/DOSE IN AEPB: INHALATION_SPRAY | RESPIRATORY_TRACT | Status: DC

## 2016-02-15 MED ORDER — ALBUTEROL SULFATE HFA 108 (90 BASE) MCG/ACT IN AERS: INHALATION_SPRAY | RESPIRATORY_TRACT | Status: DC

## 2016-02-15 NOTE — Telephone Encounter (Signed)
Left voicemail:  If you are concerned of having a prescription lost, I will have this paper prescription ready for her for pick up.  However, You should have both prescriptions with you.  You should be doing advair diskus twice daily.  You should have an albuterol on your person at all times.

## 2016-02-15 NOTE — Telephone Encounter (Signed)
SPoke with pt, she would like a hand written Rx to take with her on the cruise, just in case. Please advise.

## 2016-03-11 ENCOUNTER — Ambulatory Visit: Payer: Managed Care, Other (non HMO) | Admitting: Family Medicine

## 2016-09-30 ENCOUNTER — Other Ambulatory Visit: Payer: Self-pay

## 2016-09-30 MED ORDER — FLUTICASONE-SALMETEROL 250-50 MCG/DOSE IN AEPB: INHALATION_SPRAY | RESPIRATORY_TRACT | 0 refills | Status: DC

## 2016-09-30 NOTE — Telephone Encounter (Signed)
Last well exam 08/2015 Last refill 01/2016 with no refills. Needs ov with new pcp.

## 2016-09-30 NOTE — Telephone Encounter (Signed)
Meds ordered this encounter  Medications  . Fluticasone-Salmeterol (ADVAIR DISKUS) 250-50 MCG/DOSE AEPB    Sig: INHALE ONE PUFF INTO THE LUNGS TWICE DAILY.    Dispense:  1 each    Refill:  0    Needs office visit with new pcp for further refills

## 2016-11-25 ENCOUNTER — Ambulatory Visit (INDEPENDENT_AMBULATORY_CARE_PROVIDER_SITE_OTHER): Payer: Managed Care, Other (non HMO) | Admitting: Physician Assistant

## 2016-11-25 VITALS — BP 150/94 | HR 90 | Temp 98.4°F | Ht 62.5 in | Wt 180.0 lb

## 2016-11-25 DIAGNOSIS — Z1211 Encounter for screening for malignant neoplasm of colon: Secondary | ICD-10-CM | POA: Diagnosis not present

## 2016-11-25 DIAGNOSIS — J4540 Moderate persistent asthma, uncomplicated: Secondary | ICD-10-CM | POA: Diagnosis not present

## 2016-11-25 MED ORDER — ALBUTEROL SULFATE HFA 108 (90 BASE) MCG/ACT IN AERS: INHALATION_SPRAY | RESPIRATORY_TRACT | 0 refills | Status: DC

## 2016-11-25 MED ORDER — ALBUTEROL SULFATE HFA 108 (90 BASE) MCG/ACT IN AERS: INHALATION_SPRAY | RESPIRATORY_TRACT | 2 refills | Status: DC

## 2016-11-25 MED ORDER — FLUTICASONE-SALMETEROL 250-50 MCG/DOSE IN AEPB: INHALATION_SPRAY | RESPIRATORY_TRACT | 0 refills | Status: DC

## 2016-11-25 MED ORDER — MONTELUKAST SODIUM 10 MG PO TABS: 10.0000 mg | ORAL_TABLET | Freq: Every day | ORAL | 3 refills | Status: DC

## 2016-11-25 NOTE — Progress Notes (Signed)
Sherry Wheeler  MRN: [id number] DOB: 22-Apr-1966  PCP: Tally DueGUEST, CHRIS WARREN, MD  Subjective:  Pt is a 51 year old female who presents to clinic for medication refill for asthma medication.   History of asthma - Well controlled. started about 22 years ago.  Albuterol 108 mcg/act and Advair 250-50 mcg/dose. Uses albuterol about once every few weeks, depending on the month. Triggers are animals and seasonal changes. She takes OTC Zyrtec.   Overdue mammogram and colonoscopy. She was referred at her annual OV in 2016, however never made appt. She started her insurance would not cover a lot of the cost. Her husband's co-pay was about $800-1,000. Same with mammogram. She would like referral to someplace that is more affordable.   Review of Systems  Constitutional: Negative for chills, diaphoresis, fatigue and fever.  HENT: Negative for congestion, postnasal drip, rhinorrhea, sinus pressure, sneezing and sore throat.   Respiratory: Negative for cough, chest tightness, shortness of breath and wheezing.   Cardiovascular: Negative for chest pain and palpitations.  Gastrointestinal: Negative for abdominal pain, diarrhea, nausea and vomiting.  Neurological: Negative for weakness, light-headedness and headaches.    Patient Active Problem List   Diagnosis Date Noted  . Smoker 07/31/2015  . BMI 29.0-29.9,adult 07/31/2015  . Asthma 09/04/2012  . AR (allergic rhinitis) 09/04/2012    Current Outpatient Prescriptions on File Prior to Visit  Medication Sig Dispense Refill  . albuterol (VENTOLIN HFA) 108 (90 Base) MCG/ACT inhaler INHALE TWO PUFFS BY MOUTH EVERY 4 HOURS AS NEEDED FOR WHEEZING. 1 each 0  . clotrimazole (LOTRIMIN) 1 % cream Apply 1 application topically 2 (two) times daily. Apply until rash resolved and then for 5 more days 30 g 0  . Fluticasone-Salmeterol (ADVAIR DISKUS) 250-50 MCG/DOSE AEPB INHALE ONE PUFF INTO THE LUNGS TWICE DAILY. 1 each 0   No current facility-administered  medications on file prior to visit.     Allergies  Allergen Reactions  . Zoloft [Sertraline Hcl] Rash     Objective:  BP (!) 150/94 (BP Location: Right Arm, Patient Position: Sitting, Cuff Size: Small)   Pulse 90   Temp 98.4 F (36.9 C) (Oral)   Ht 5' 2.5" (1.588 m)   Wt 180 lb (81.6 kg)   LMP 03/05/2016 (Approximate)   SpO2 97%   BMI 32.40 kg/m   Physical Exam  Constitutional: She is oriented to person, place, and time and well-developed, well-nourished, and in no distress. No distress.  Cardiovascular: Normal rate, regular rhythm and normal heart sounds.   Pulmonary/Chest: Effort normal. No respiratory distress. She has no wheezes.  Neurological: She is alert and oriented to person, place, and time. GCS score is 15.  Skin: Skin is warm and dry.  Psychiatric: Mood, memory, affect and judgment normal.  Vitals reviewed.   Assessment and Plan :  1. Moderate persistent asthma, unspecified whether complicated - montelukast (SINGULAIR) 10 MG tablet; Take 1 tablet (10 mg total) by mouth at bedtime.  Dispense: 30 tablet; Refill: 3 - Fluticasone-Salmeterol (ADVAIR DISKUS) 250-50 MCG/DOSE AEPB; INHALE ONE PUFF INTO THE LUNGS TWICE DAILY.  Dispense: 1 each; Refill: 0 - albuterol (VENTOLIN HFA) 108 (90 Base) MCG/ACT inhaler; INHALE TWO PUFFS BY MOUTH EVERY 4 HOURS AS NEEDED FOR WHEEZING.  Dispense: 1 each; Refill: 2 - Asthma is well controlled. Will start on Singulair, as she also suffers from allergies. F/u in 6 months.  2. Colon cancer screening - Ambulatory referral to Gastroenterology - Encouraged pt to make appt for annual exam.  Marco Collie, PA-C  [facility] 11/25/2016 4:11 PM

## 2016-11-25 NOTE — Patient Instructions (Addendum)
   Thank you for coming in today. I hope you feel we met your needs.  Feel free to call [facility] if you have any questions or further requests.  Please consider signing up for MyChart if you do not already have it, as this is a great way to communicate with me.  Best,  Whitney McVey, PA-C   IF you received an x-ray today, you will receive an invoice from [facility]. Please contact  Radiology at [phone] with questions or concerns regarding your invoice.   IF you received labwork today, you will receive an invoice from [facility]. Please contact [facility] at [phone] with questions or concerns regarding your invoice.   Our billing staff will not be able to assist you with questions regarding bills from these companies.  You will be contacted with the lab results as soon as they are available. The fastest way to get your results is to activate your My Chart account. Instructions are located on the last page of this paperwork. If you have not heard from us regarding the results in 2 weeks, please contact this office.      

## 2017-01-13 LAB — HM COLONOSCOPY

## 2017-01-28 ENCOUNTER — Other Ambulatory Visit: Payer: Self-pay | Admitting: Physician Assistant

## 2017-01-28 DIAGNOSIS — J4540 Moderate persistent asthma, uncomplicated: Secondary | ICD-10-CM

## 2017-01-28 NOTE — Telephone Encounter (Signed)
Seen 11/2016 by Ms McVey. Advised to RTC in 6 months.  Meds ordered this encounter  Medications  . ADVAIR DISKUS 250-50 MCG/DOSE AEPB    Sig: INHALE ONE DOSE BY MOUTH TWICE DAILY.    Dispense:  180 each    Refill:  1    Please consider 90 day supplies to promote better adherence

## 2018-01-08 ENCOUNTER — Telehealth: Payer: Self-pay | Admitting: Physician Assistant

## 2018-01-08 NOTE — Telephone Encounter (Signed)
Copied from CRM [id number]. Topic: Quick Communication - Rx Refill/Question >> Jan 08, 2018  3:53 PM Jonette EvaBarksdale, Harvey B wrote: Medication: ADVAIR DISKUS 250-50 MCG/DOSE AEPB [[id number]][[id number]] , albuterol (VENTOLIN HFA) 108 (90 Base) MCG/ACT inhaler [[id number]   Has the patient contacted their pharmacy? No.   (Agent: If no, request that the patient contact the pharmacy for the refill.)   Preferred Pharmacy (with phone number or street name): [facility]   Agent: Please be advised that RX refills may take up to 3 business days. We ask that you follow-up with your pharmacy.

## 2018-01-13 ENCOUNTER — Other Ambulatory Visit: Payer: Self-pay

## 2018-01-13 DIAGNOSIS — J4540 Moderate persistent asthma, uncomplicated: Secondary | ICD-10-CM

## 2018-01-13 MED ORDER — FLUTICASONE-SALMETEROL 250-50 MCG/DOSE IN AEPB: 1.0000 | INHALATION_SPRAY | Freq: Two times a day (BID) | RESPIRATORY_TRACT | 0 refills | Status: DC

## 2018-01-13 NOTE — Telephone Encounter (Signed)
Pt has made an appt to see E Mcvey on 3/11, but going out of town today and needs to take this  ADVAIR DISKUS 250-50 MCG[facility]SE [facility]   [facility] [address] - [place], [place] - [phone] (Phone) [phone] (Fax)    Can you send today for the pt?

## 2018-01-26 ENCOUNTER — Other Ambulatory Visit: Payer: Self-pay

## 2018-01-26 ENCOUNTER — Ambulatory Visit: Payer: Managed Care, Other (non HMO) | Admitting: Physician Assistant

## 2018-01-26 ENCOUNTER — Encounter: Payer: Self-pay | Admitting: Physician Assistant

## 2018-01-26 VITALS — BP 141/84 | HR 92 | Temp 98.5°F | Resp 16 | Ht 62.0 in | Wt 178.0 lb

## 2018-01-26 DIAGNOSIS — R300 Dysuria: Secondary | ICD-10-CM | POA: Diagnosis not present

## 2018-01-26 DIAGNOSIS — N3001 Acute cystitis with hematuria: Secondary | ICD-10-CM

## 2018-01-26 LAB — POC MICROSCOPIC URINALYSIS (UMFC): Mucus: ABSENT

## 2018-01-26 LAB — POCT URINALYSIS DIP (MANUAL ENTRY)
Bilirubin, UA: NEGATIVE
Glucose, UA: NEGATIVE mg/dL
Nitrite, UA: NEGATIVE
Protein Ur, POC: NEGATIVE mg/dL
Spec Grav, UA: 1.025 (ref 1.010–1.025)
Urobilinogen, UA: 0.2 E.U./dL
pH, UA: 6 (ref 5.0–8.0)

## 2018-01-26 MED ORDER — NITROFURANTOIN MONOHYD MACRO 100 MG PO CAPS: 100.0000 mg | ORAL_CAPSULE | Freq: Two times a day (BID) | ORAL | 0 refills | Status: DC

## 2018-01-26 NOTE — Progress Notes (Signed)
Sherry Wheeler  MRN: [id number] DOB: Mar 30, 1966  PCP: Sebastian AcheMcVey, Rahaf Carbonell Whitney, PA-C  Subjective:  Pt is a pleasant 52 year old female who presents to clinic for astma f/u.   History of asthma - Well controlled. started about 22 years ago.  Albuterol 108 mcg/act and Advair 250-50 mcg/dose. Uses albuterol about once every few weeks, depending on the month. Triggers are animals and seasonal changes. She takes OTC Zyrtec. Started singulair at last OV 1/8 - this is too expensive for her.   Urinary symptoms x a few weeks. "I feel like I have to go when I don't have to". She has been taking AZO. Last dose a few days ago "I was concerned I was taking too much" denies dysuria, flank pain, fever, chills, abdominal pain, blood in urine.   Review of Systems  Constitutional: Negative for chills, fatigue and fever.  Respiratory: Negative for cough, shortness of breath and wheezing.   Cardiovascular: Negative for chest pain and palpitations.  Gastrointestinal: Negative for abdominal pain, nausea and vomiting.  Genitourinary: Positive for frequency and urgency. Negative for decreased urine volume, difficulty urinating, dysuria, enuresis, flank pain and hematuria.  Musculoskeletal: Negative for back pain.    Patient Active Problem List   Diagnosis Date Noted  . Smoker 07/31/2015  . BMI 29.0-29.9,adult 07/31/2015  . Asthma 09/04/2012  . AR (allergic rhinitis) 09/04/2012    Current Outpatient Medications on File Prior to Visit  Medication Sig Dispense Refill  . albuterol (VENTOLIN HFA) 108 (90 Base) MCG/ACT inhaler INHALE TWO PUFFS BY MOUTH EVERY 4 HOURS AS NEEDED FOR WHEEZING. 1 each 2  . Fluticasone-Salmeterol (ADVAIR DISKUS) 250-50 MCG/DOSE AEPB Inhale 1 puff into the lungs 2 (two) times daily. 180 each 0  . clotrimazole (LOTRIMIN) 1 % cream Apply 1 application topically 2 (two) times daily. Apply until rash resolved and then for 5 more days (Patient not taking: Reported on 01/26/2018) 30 g 0   . montelukast (SINGULAIR) 10 MG tablet Take 1 tablet (10 mg total) by mouth at bedtime. (Patient not taking: Reported on 01/26/2018) 30 tablet 3   No current facility-administered medications on file prior to visit.     Allergies  Allergen Reactions  . Zoloft [Sertraline Hcl] Rash     Objective:  BP (!) 141/84   Pulse 92   Temp 98.5 F (36.9 C) (Oral)   Resp 16   Ht 5\' 2"  (1.575 m)   Wt 178 lb (80.7 kg)   SpO2 96%   BMI 32.56 kg/m   Physical Exam  Constitutional: She is oriented to person, place, and time and well-developed, well-nourished, and in no distress. No distress.  Cardiovascular: Normal rate, regular rhythm and normal heart sounds.  Abdominal: Soft. Normal appearance. There is no tenderness. There is no CVA tenderness.  Neurological: She is alert and oriented to person, place, and time. GCS score is 15.  Skin: Skin is warm and dry.  Psychiatric: Mood, memory, affect and judgment normal.  Vitals reviewed.   Results for orders placed or performed in visit on 01/26/18  POCT urinalysis dipstick  Result Value Ref Range   Color, UA yellow yellow   Clarity, UA cloudy (A) clear   Glucose, UA negative negative mg/dL   Bilirubin, UA negative negative   Ketones, POC UA trace (5) (A) negative mg/dL   Spec Grav, UA 4.0981.025 1.1911.010 - 1.025   Blood, UA moderate (A) negative   pH, UA 6.0 5.0 - 8.0   Protein Ur, POC negative  negative mg/dL   Urobilinogen, UA 0.2 0.2 or 1.0 E.U./dL   Nitrite, UA Negative Negative   Leukocytes, UA Small (1+) (A) Negative  POCT Microscopic Urinalysis ([facility])  Result Value Ref Range   WBC,UR,HPF,POC Many (A) None WBC/hpf   RBC,UR,HPF,POC Moderate (A) None RBC/hpf   Bacteria Many (A) None, Too numerous to count   Mucus Absent Absent   Epithelial Cells, UR Per Microscopy None None, Too numerous to count cells/hpf    Assessment and Plan :  1. Acute cystitis with hematuria 2. Dysuria - nitrofurantoin, macrocrystal-monohydrate, (MACROBID) 100  MG capsule; Take 1 capsule (100 mg total) by mouth 2 (two) times daily.  Dispense: 20 capsule; Refill: 0 - Urine Culture - POCT urinalysis dipstick - POCT Microscopic Urinalysis ([facility]) - Pt presents with urinary symptoms x a few weeks. Not concerned today for pyelonephritis. +leukocytes on UA. Plan to treat for uncomplicated cystitis. Culture is pending. Stay well hydrated. RTC if no improvement.    Marco Collie, PA-C  Primary Care at [facility] 01/26/2018 5:02 PM

## 2018-01-26 NOTE — Patient Instructions (Addendum)
  Start taking macrobid. Take the entire course of your antibiotic even if you start to feel better.  Stay well hydrated - drink at least 1-2 liters of water/daily.  Come back if your symptoms worsen.   Urinary Tract Infection, Adult A urinary tract infection (UTI) is an infection of any part of the urinary tract. The urinary tract includes the:  Kidneys.  Ureters.  Bladder.  Urethra.  These organs make, store, and get rid of pee (urine) in the body. Follow these instructions at home:  Take over-the-counter and prescription medicines only as told by your doctor.  If you were prescribed an antibiotic medicine, take it as told by your doctor. Do not stop taking the antibiotic even if you start to feel better.  Avoid the following drinks: ? Alcohol. ? Caffeine. ? Tea. ? Carbonated drinks.  Drink enough fluid to keep your pee clear or pale yellow.  Keep all follow-up visits as told by your doctor. This is important.  Make sure to: ? Empty your bladder often and completely. Do not to hold pee for long periods of time. ? Empty your bladder before and after sex. ? Wipe from front to back after a bowel movement if you are female. Use each tissue one time when you wipe. Contact a doctor if:  You have back pain.  You have a fever.  You feel sick to your stomach (nauseous).  You throw up (vomit).  Your symptoms do not get better after 3 days.  Your symptoms go away and then come back. Get help right away if:  You have very bad back pain.  You have very bad lower belly (abdominal) pain.  You are throwing up and cannot keep down any medicines or water. This information is not intended to replace advice given to you by your health care provider. Make sure you discuss any questions you have with your health care provider. Document Released: 04/22/2008 Document Revised: 04/11/2016 Document Reviewed: 09/25/2015 Elsevier Interactive Patient Education  2018 [employer]  IF  you received an x-ray today, you will receive an invoice from [facility]. Please contact [facility] at [phone] with questions or concerns regarding your invoice.   IF you received labwork today, you will receive an invoice from [facility]. Please contact [facility] at [phone] with questions or concerns regarding your invoice.   Our billing staff will not be able to assist you with questions regarding bills from these companies.  You will be contacted with the lab results as soon as they are available. The fastest way to get your results is to activate your My Chart account. Instructions are located on the last page of this paperwork. If you have not heard from us regarding the results in 2 weeks, please contact this office.

## 2018-01-28 LAB — URINE CULTURE

## 2018-07-03 ENCOUNTER — Other Ambulatory Visit: Payer: Self-pay | Admitting: Physician Assistant

## 2018-07-03 DIAGNOSIS — J4540 Moderate persistent asthma, uncomplicated: Secondary | ICD-10-CM

## 2019-01-04 ENCOUNTER — Other Ambulatory Visit: Payer: Self-pay | Admitting: Physician Assistant

## 2019-01-04 DIAGNOSIS — J4540 Moderate persistent asthma, uncomplicated: Secondary | ICD-10-CM

## 2019-01-04 NOTE — Telephone Encounter (Signed)
Requested Prescriptions  Pending Prescriptions Disp Refills  . Fluticasone-Salmeterol (ADVAIR) 250-50 MCG/DOSE AEPB [Pharmacy Med Name: Fluticasone-Salmeterol 250-50 MCG/DOSE Inhalation Aerosol Powder Breath Activated] 180 each 0    Sig: INHALE 1 DOSE BY MOUTH TWICE DAILY     Pulmonology:  Combination Products Passed - 01/04/2019 10:56 AM      Passed - Valid encounter within last 12 months    Recent Outpatient Visits          11 months ago Acute cystitis with hematuria   Primary Care at [facility], Madelaine Bhat, PA-C   2 years ago Moderate persistent asthma, unspecified whether complicated   Primary Care at [facility], Madelaine Bhat, PA-C   3 years ago Annual physical exam   Primary Care at [facility], Binnie Rail, FNP   3 years ago Asthma, chronic, moderate persistent, uncomplicated   Primary Care at [facility], Harrel Lemon, MD   3 years ago Cellulitis of neck   Primary Care at [facility], Gwenlyn Found, MD

## 2019-06-30 ENCOUNTER — Other Ambulatory Visit: Payer: Self-pay | Admitting: Physician Assistant

## 2019-06-30 DIAGNOSIS — J4540 Moderate persistent asthma, uncomplicated: Secondary | ICD-10-CM

## 2019-07-05 ENCOUNTER — Telehealth: Payer: Self-pay | Admitting: Physician Assistant

## 2019-07-05 NOTE — Telephone Encounter (Signed)
Pt scheduled TOC with Sagardia. Reuqesting refill for Fluticasone-Salmeterol

## 2019-07-07 ENCOUNTER — Other Ambulatory Visit: Payer: Self-pay | Admitting: Physician Assistant

## 2019-07-07 DIAGNOSIS — J4540 Moderate persistent asthma, uncomplicated: Secondary | ICD-10-CM

## 2019-07-08 ENCOUNTER — Other Ambulatory Visit: Payer: Self-pay | Admitting: Emergency Medicine

## 2019-07-08 DIAGNOSIS — J4540 Moderate persistent asthma, uncomplicated: Secondary | ICD-10-CM

## 2019-07-08 MED ORDER — FLUTICASONE-SALMETEROL 250-50 MCG/DOSE IN AEPB: INHALATION_SPRAY | RESPIRATORY_TRACT | 0 refills | Status: DC

## 2019-07-08 NOTE — Telephone Encounter (Signed)
Courtesy refill has been sent for this inhaler but no further refills without office visit

## 2019-07-19 ENCOUNTER — Encounter: Payer: Managed Care, Other (non HMO) | Admitting: Emergency Medicine

## 2019-12-20 ENCOUNTER — Other Ambulatory Visit: Payer: Self-pay

## 2019-12-20 ENCOUNTER — Ambulatory Visit (INDEPENDENT_AMBULATORY_CARE_PROVIDER_SITE_OTHER): Payer: 59 | Admitting: Emergency Medicine

## 2019-12-20 ENCOUNTER — Encounter: Payer: Self-pay | Admitting: Emergency Medicine

## 2019-12-20 VITALS — BP 134/69 | HR 83 | Temp 98.3°F | Resp 16 | Ht 62.0 in | Wt 183.0 lb

## 2019-12-20 DIAGNOSIS — Z7689 Persons encountering health services in other specified circumstances: Secondary | ICD-10-CM | POA: Diagnosis not present

## 2019-12-20 DIAGNOSIS — J4520 Mild intermittent asthma, uncomplicated: Secondary | ICD-10-CM | POA: Diagnosis not present

## 2019-12-20 DIAGNOSIS — B354 Tinea corporis: Secondary | ICD-10-CM | POA: Diagnosis not present

## 2019-12-20 MED ORDER — FLUTICASONE-SALMETEROL 250-50 MCG/DOSE IN AEPB: INHALATION_SPRAY | RESPIRATORY_TRACT | 5 refills | Status: DC

## 2019-12-20 MED ORDER — CLOTRIMAZOLE 1 % EX CREA: 1.0000 "application " | TOPICAL_CREAM | Freq: Two times a day (BID) | CUTANEOUS | 5 refills | Status: DC

## 2019-12-20 NOTE — Progress Notes (Signed)
Sherry Wheeler 54 y.o.   Chief Complaint  Patient presents with  . Asthma    per patient check up    HISTORY OF PRESENT ILLNESS: This is a 54 y.o. female here to establish care with me.  Has history of asthma on Advair daily and albuterol as needed.  Needs medication refills. 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 No other complaints or medical concerns today.  HPI   Prior to Admission medications   Medication Sig Start Date End Date Taking? Authorizing Provider  albuterol (VENTOLIN HFA) 108 (90 Base) MCG/ACT inhaler INHALE TWO PUFFS BY MOUTH EVERY 4 HOURS AS NEEDED FOR WHEEZING. 11/25/16  Yes Sherry Wheeler, Sherry Mink, PA-C  Fluticasone-Salmeterol (ADVAIR) 250-50 MCG/DOSE AEPB INHALE 1 DOSE BY MOUTH TWICE DAILY 12/20/19  Yes Sherry Wheeler, Sherry Bloomer, MD  clotrimazole (LOTRIMIN) 1 % cream Apply 1 application topically 2 (two) times daily. Apply until rash resolved and then for 5 more days 12/20/19   Sherry Pollen, MD  montelukast (SINGULAIR) 10 MG tablet Take 1 tablet (10 mg total) by mouth at bedtime. Patient not taking: Reported on 12/20/2019 11/25/16   Sherry Wheeler, Sherry Mink, PA-C  nitrofurantoin, macrocrystal-monohydrate, (MACROBID) 100 MG capsule Take 1 capsule (100 mg total) by mouth 2 (two) times daily. Patient not taking: Reported on 12/20/2019 01/26/18   Sherry Wheeler, Sherry Mink, PA-C    Allergies  Allergen Reactions  . Zoloft [Sertraline Hcl] Rash    Patient Active Problem List   Diagnosis Date Noted  . Smoker 07/31/2015  . BMI 29.0-29.9,adult 07/31/2015  . Asthma 09/04/2012    Past Medical History:  Diagnosis Date  . Allergy   . Asthma     Past Surgical History:  Procedure Laterality Date  . CESAREAN SECTION      Social History   Socioeconomic History  . Marital status: Married    Spouse name: Sherry Wheeler  . Number of children: 1  . Years of education: 14+  . Highest education  level: Not on file  Occupational History  . Occupation: waitress    Comment: [employer]  Tobacco Use  . Smoking status: Former Smoker    Packs/day: 1.00    Types: Cigarettes  . Smokeless tobacco: Never Used  Substance and Sexual Activity  . Alcohol use: Yes    Alcohol/week: 0.0 standard drinks    Comment: social-rare  . Drug use: No  . Sexual activity: Not Currently  Other Topics Concern  . Not on file  Social History Narrative  . Not on file   Social Determinants of Health   Financial Resource Strain:   . Difficulty of Paying Living Expenses: Not on file  Food Insecurity:   . Worried About Charity fundraiser in the Last Year: Not on file  . Ran Out of Food in the Last Year: Not on file  Transportation Needs:   . Lack of Transportation (Medical): Not on file  . Lack of Transportation (Non-Medical): Not on file  Physical Activity:   . Days of Exercise per Week: Not on file  . Minutes of Exercise per Session: Not on file  Stress:   . Feeling of Stress : Not on file  Social Connections:   . Frequency of Communication with Friends and Family: Not on file  . Frequency of Social Gatherings with Friends and Family: Not on file  . Attends Religious Services: Not on file  . Active Member of Clubs or Organizations: Not on  file  . Attends Banker Meetings: Not on file  . Marital Status: Not on file  Intimate Partner Violence:   . Fear of Current or Ex-Partner: Not on file  . Emotionally Abused: Not on file  . Physically Abused: Not on file  . Sexually Abused: Not on file    Family History  Problem Relation Age of Onset  . Hypertension Father   . Diabetes Paternal Grandmother   . Heart disease Paternal Grandmother   . Cancer Paternal Grandfather      Review of Systems  Constitutional: Negative.  Negative for chills and fever.  HENT: Negative.  Negative for congestion and sore throat.   Respiratory: Negative.  Negative for cough and shortness of breath.     Cardiovascular: Negative.  Negative for chest pain and palpitations.  Gastrointestinal: Negative.  Negative for abdominal pain, blood in stool, diarrhea, melena, nausea and vomiting.  Genitourinary: Negative.  Negative for dysuria and hematuria.  Musculoskeletal: Negative.  Negative for back pain, myalgias and neck pain.  Skin: Positive for rash (Between her breasts).  Neurological: Negative.  Negative for dizziness and headaches.  Endo/Heme/Allergies: Negative.   All other systems reviewed and are negative.  Today's Vitals   12/20/19 1440  BP: 134/69  Pulse: 83  Resp: 16  Temp: 98.3 F (36.8 C)  TempSrc: Temporal  SpO2: 95%  Weight: 183 lb (83 kg)  Height: 5\' 2"  (1.575 m)   Body mass index is 33.47 kg/m.   Physical Exam Vitals reviewed.  Constitutional:      Appearance: Normal appearance.  HENT:     Head: Normocephalic.  Eyes:     Extraocular Movements: Extraocular movements intact.     Conjunctiva/sclera: Conjunctivae normal.     Pupils: Pupils are equal, round, and reactive to light.  Cardiovascular:     Rate and Rhythm: Normal rate and regular rhythm.     Pulses: Normal pulses.     Heart sounds: Normal heart sounds.  Pulmonary:     Effort: Pulmonary effort is normal.     Breath sounds: Normal breath sounds.  Musculoskeletal:        General: Normal range of motion.     Cervical back: Normal range of motion and neck supple.  Skin:    General: Skin is warm and dry.     Capillary Refill: Capillary refill takes less than 2 seconds.     Findings: Rash present.     Comments: Yeast dermatitis rash sternal area between her breasts  Neurological:     General: No focal deficit present.     Mental Status: She is alert and oriented to person, place, and time.  Psychiatric:        Mood and Affect: Mood normal.        Behavior: Behavior normal.      ASSESSMENT & PLAN: Sherry Wheeler was seen today for asthma.  Diagnoses and all orders for this visit:  Mild intermittent  asthma without complication -     Fluticasone-Salmeterol (ADVAIR) 250-50 MCG/DOSE AEPB; INHALE 1 DOSE BY MOUTH TWICE DAILY  Tinea corporis -     clotrimazole (LOTRIMIN) 1 % cream; Apply 1 application topically 2 (two) times daily. Apply until rash resolved and then for 5 more days  Encounter to establish care    Patient Instructions       If you have lab work done today you will be contacted with your lab results within the next 2 weeks.  If you have not heard  from us then please contact us. The fastest way to get your results is to register for My Chart.   IF you received an x-ray today, you will receive an invoice from [facility]. Please contact [facility] at [phone] with questions or concerns regarding your invoice.   IF you received labwork today, you will receive an invoice from [facility]. Please contact [facility] at [phone] with questions or concerns regarding your invoice.   Our billing staff will not be able to assist you with questions regarding bills from these companies.  You will be contacted with the lab results as soon as they are available. The fastest way to get your results is to activate your My Chart account. Instructions are located on the last page of this paperwork. If you have not heard from us regarding the results in 2 weeks, please contact this office.     Health Maintenance, Female Adopting a healthy lifestyle and getting preventive care are important in promoting health and wellness. Ask your health care provider about:  The right schedule for you to have regular tests and exams.  Things you can do on your own to prevent diseases and keep yourself healthy. What should I know about diet, weight, and exercise? Eat a healthy diet   Eat a diet that includes plenty of vegetables, fruits, low-fat dairy products, and lean protein.  Do not eat a lot of foods that are high in solid fats, added sugars, or sodium. Maintain a  healthy weight Body mass index (BMI) is used to identify weight problems. It estimates body fat based on height and weight. Your health care provider can help determine your BMI and help you achieve or maintain a healthy weight. Get regular exercise Get regular exercise. This is one of the most important things you can do for your health. Most adults should:  Exercise for at least 150 minutes each week. The exercise should increase your heart rate and make you sweat (moderate-intensity exercise).  Do strengthening exercises at least twice a week. This is in addition to the moderate-intensity exercise.  Spend less time sitting. Even light physical activity can be beneficial. Watch cholesterol and blood lipids Have your blood tested for lipids and cholesterol at 55 years of age, then have this test every 5 years. Have your cholesterol levels checked more often if:  Your lipid or cholesterol levels are high.  You are older than 55 years of age.  You are at high risk for heart disease. What should I know about cancer screening? Depending on your health history and family history, you may need to have cancer screening at various ages. This may include screening for:  Breast cancer.  Cervical cancer.  Colorectal cancer.  Skin cancer.  Lung cancer. What should I know about heart disease, diabetes, and high blood pressure? Blood pressure and heart disease  High blood pressure causes heart disease and increases the risk of stroke. This is more likely to develop in people who have high blood pressure readings, are of African descent, or are overweight.  Have your blood pressure checked: ? Every 3-5 years if you are 6918-55 years of age. ? Every year if you are 55 years old or older. Diabetes Have regular diabetes screenings. This checks your fasting blood sugar level. Have the screening done:  Once every three years after age 55 if you are at a normal weight and have a low risk for  diabetes.  More often and at a younger age if you  are overweight or have a high risk for diabetes. What should I know about preventing infection? Hepatitis B If you have a higher risk for hepatitis B, you should be screened for this virus. Talk with your health care provider to find out if you are at risk for hepatitis B infection. Hepatitis C Testing is recommended for:  Everyone born from [birth year] through 1965.  Anyone with known risk factors for hepatitis C. Sexually transmitted infections (STIs)  Get screened for STIs, including gonorrhea and chlamydia, if: ? You are sexually active and are younger than 55 years of age. ? You are older than 55 years of age and your health care provider tells you that you are at risk for this type of infection. ? Your sexual activity has changed since you were last screened, and you are at increased risk for chlamydia or gonorrhea. Ask your health care provider if you are at risk.  Ask your health care provider about whether you are at high risk for HIV. Your health care provider may recommend a prescription medicine to help prevent HIV infection. If you choose to take medicine to prevent HIV, you should first get tested for HIV. You should then be tested every 3 months for as long as you are taking the medicine. Pregnancy  If you are about to stop having your period (premenopausal) and you may become pregnant, seek counseling before you get pregnant.  Take 400 to 800 micrograms (mcg) of folic acid every day if you become pregnant.  Ask for birth control (contraception) if you want to prevent pregnancy. Osteoporosis and menopause Osteoporosis is a disease in which the bones lose minerals and strength with aging. This can result in bone fractures. If you are 37 years old or older, or if you are at risk for osteoporosis and fractures, ask your health care provider if you should:  Be screened for bone loss.  Take a calcium or vitamin D supplement to lower  your risk of fractures.  Be given hormone replacement therapy (HRT) to treat symptoms of menopause. Follow these instructions at home: Lifestyle  Do not use any products that contain nicotine or tobacco, such as cigarettes, e-cigarettes, and chewing tobacco. If you need help quitting, ask your health care provider.  Do not use street drugs.  Do not share needles.  Ask your health care provider for help if you need support or information about quitting drugs. Alcohol use  Do not drink alcohol if: ? Your health care provider tells you not to drink. ? You are pregnant, may be pregnant, or are planning to become pregnant.  If you drink alcohol: ? Limit how much you use to 0-1 drink a day. ? Limit intake if you are breastfeeding.  Be aware of how much alcohol is in your drink. In the [place] one drink equals one 12 oz bottle of beer (355 mL), one 5 oz glass of wine (148 mL), or one 1 oz glass of hard liquor (44 mL). General instructions  Schedule regular health, dental, and eye exams.  Stay current with your vaccines.  Tell your health care provider if: ? You often feel depressed. ? You have ever been abused or do not feel safe at home. Summary  Adopting a healthy lifestyle and getting preventive care are important in promoting health and wellness.  Follow your health care provider's instructions about healthy diet, exercising, and getting tested or screened for diseases.  Follow your health care provider's instructions on monitoring your cholesterol and  blood pressure. This information is not intended to replace advice given to you by your health care provider. Make sure you discuss any questions you have with your health care provider. Document Revised: 10/28/2018 Document Reviewed: 10/28/2018 Elsevier Patient Education  2020 Elsevier Inc.      Edwina Barth, MD [facility]

## 2019-12-20 NOTE — Patient Instructions (Addendum)
   If you have lab work done today you will be contacted with your lab results within the next 2 weeks.  If you have not heard from us then please contact us. The fastest way to get your results is to register for My Chart.   IF you received an x-ray today, you will receive an invoice from [facility]. Please contact [facility] at [phone] with questions or concerns regarding your invoice.   IF you received labwork today, you will receive an invoice from [facility]. Please contact [facility] at [phone] with questions or concerns regarding your invoice.   Our billing staff will not be able to assist you with questions regarding bills from these companies.  You will be contacted with the lab results as soon as they are available. The fastest way to get your results is to activate your My Chart account. Instructions are located on the last page of this paperwork. If you have not heard from us regarding the results in 2 weeks, please contact this office.       Health Maintenance, Female Adopting a healthy lifestyle and getting preventive care are important in promoting health and wellness. Ask your health care provider about:  The right schedule for you to have regular tests and exams.  Things you can do on your own to prevent diseases and keep yourself healthy. What should I know about diet, weight, and exercise? Eat a healthy diet   Eat a diet that includes plenty of vegetables, fruits, low-fat dairy products, and lean protein.  Do not eat a lot of foods that are high in solid fats, added sugars, or sodium. Maintain a healthy weight Body mass index (BMI) is used to identify weight problems. It estimates body fat based on height and weight. Your health care provider can help determine your BMI and help you achieve or maintain a healthy weight. Get regular exercise Get regular exercise. This is one of the most important things you can do for your health. Most  adults should:  Exercise for at least 150 minutes each week. The exercise should increase your heart rate and make you sweat (moderate-intensity exercise).  Do strengthening exercises at least twice a week. This is in addition to the moderate-intensity exercise.  Spend less time sitting. Even light physical activity can be beneficial. Watch cholesterol and blood lipids Have your blood tested for lipids and cholesterol at 55 years of age, then have this test every 5 years. Have your cholesterol levels checked more often if:  Your lipid or cholesterol levels are high.  You are older than 55 years of age.  You are at high risk for heart disease. What should I know about cancer screening? Depending on your health history and family history, you may need to have cancer screening at various ages. This may include screening for:  Breast cancer.  Cervical cancer.  Colorectal cancer.  Skin cancer.  Lung cancer. What should I know about heart disease, diabetes, and high blood pressure? Blood pressure and heart disease  High blood pressure causes heart disease and increases the risk of stroke. This is more likely to develop in people who have high blood pressure readings, are of African descent, or are overweight.  Have your blood pressure checked: ? Every 3-5 years if you are 18-39 years of age. ? Every year if you are 40 years old or older. Diabetes Have regular diabetes screenings. This checks your fasting blood sugar level. Have the screening done:  Once   every three years after age 40 if you are at a normal weight and have a low risk for diabetes.  More often and at a younger age if you are overweight or have a high risk for diabetes. What should I know about preventing infection? Hepatitis B If you have a higher risk for hepatitis B, you should be screened for this virus. Talk with your health care provider to find out if you are at risk for hepatitis B infection. Hepatitis  C Testing is recommended for:  Everyone born from 1945 through 1965.  Anyone with known risk factors for hepatitis C. Sexually transmitted infections (STIs)  Get screened for STIs, including gonorrhea and chlamydia, if: ? You are sexually active and are younger than 55 years of age. ? You are older than 55 years of age and your health care provider tells you that you are at risk for this type of infection. ? Your sexual activity has changed since you were last screened, and you are at increased risk for chlamydia or gonorrhea. Ask your health care provider if you are at risk.  Ask your health care provider about whether you are at high risk for HIV. Your health care provider may recommend a prescription medicine to help prevent HIV infection. If you choose to take medicine to prevent HIV, you should first get tested for HIV. You should then be tested every 3 months for as long as you are taking the medicine. Pregnancy  If you are about to stop having your period (premenopausal) and you may become pregnant, seek counseling before you get pregnant.  Take 400 to 800 micrograms (mcg) of folic acid every day if you become pregnant.  Ask for birth control (contraception) if you want to prevent pregnancy. Osteoporosis and menopause Osteoporosis is a disease in which the bones lose minerals and strength with aging. This can result in bone fractures. If you are 65 years old or older, or if you are at risk for osteoporosis and fractures, ask your health care provider if you should:  Be screened for bone loss.  Take a calcium or vitamin D supplement to lower your risk of fractures.  Be given hormone replacement therapy (HRT) to treat symptoms of menopause. Follow these instructions at home: Lifestyle  Do not use any products that contain nicotine or tobacco, such as cigarettes, e-cigarettes, and chewing tobacco. If you need help quitting, ask your health care provider.  Do not use street  drugs.  Do not share needles.  Ask your health care provider for help if you need support or information about quitting drugs. Alcohol use  Do not drink alcohol if: ? Your health care provider tells you not to drink. ? You are pregnant, may be pregnant, or are planning to become pregnant.  If you drink alcohol: ? Limit how much you use to 0-1 drink a day. ? Limit intake if you are breastfeeding.  Be aware of how much alcohol is in your drink. In the [place] one drink equals one 12 oz bottle of beer (355 mL), one 5 oz glass of wine (148 mL), or one 1 oz glass of hard liquor (44 mL). General instructions  Schedule regular health, dental, and eye exams.  Stay current with your vaccines.  Tell your health care provider if: ? You often feel depressed. ? You have ever been abused or do not feel safe at home. Summary  Adopting a healthy lifestyle and getting preventive care are important in promoting health and   wellness.  Follow your health care provider's instructions about healthy diet, exercising, and getting tested or screened for diseases.  Follow your health care provider's instructions on monitoring your cholesterol and blood pressure. This information is not intended to replace advice given to you by your health care provider. Make sure you discuss any questions you have with your health care provider. Document Revised: 10/28/2018 Document Reviewed: 10/28/2018 Elsevier Patient Education  2020 Elsevier Inc.  

## 2020-08-10 ENCOUNTER — Other Ambulatory Visit: Payer: Self-pay | Admitting: Emergency Medicine

## 2020-08-10 DIAGNOSIS — Z1231 Encounter for screening mammogram for malignant neoplasm of breast: Secondary | ICD-10-CM

## 2020-08-14 ENCOUNTER — Other Ambulatory Visit: Payer: Self-pay

## 2020-08-14 ENCOUNTER — Ambulatory Visit
Admission: RE | Admit: 2020-08-14 | Discharge: 2020-08-14 | Disposition: A | Discharge status: Home / Self Care | Payer: Self-pay | Source: Ambulatory Visit | Attending: Emergency Medicine | Admitting: Emergency Medicine

## 2020-08-14 DIAGNOSIS — Z1231 Encounter for screening mammogram for malignant neoplasm of breast: Secondary | ICD-10-CM

## 2020-08-16 ENCOUNTER — Other Ambulatory Visit: Payer: Self-pay | Admitting: Emergency Medicine

## 2020-08-16 DIAGNOSIS — R928 Other abnormal and inconclusive findings on diagnostic imaging of breast: Secondary | ICD-10-CM

## 2020-08-17 ENCOUNTER — Telehealth: Payer: Self-pay | Admitting: *Deleted

## 2020-08-17 ENCOUNTER — Encounter: Payer: Self-pay | Admitting: *Deleted

## 2020-08-17 NOTE — Telephone Encounter (Signed)
Faxed signed URGENT order to [facility] for mammogram and ultrasound, possible bilateral breast mass. Confirmation page 10:47.

## 2020-09-04 ENCOUNTER — Other Ambulatory Visit: Payer: Self-pay

## 2020-09-04 ENCOUNTER — Other Ambulatory Visit: Payer: Self-pay | Admitting: Emergency Medicine

## 2020-09-04 ENCOUNTER — Ambulatory Visit
Admission: RE | Admit: 2020-09-04 | Discharge: 2020-09-04 | Disposition: A | Discharge status: Home / Self Care | Payer: 59 | Source: Ambulatory Visit | Attending: Emergency Medicine | Admitting: Emergency Medicine

## 2020-09-04 DIAGNOSIS — R928 Other abnormal and inconclusive findings on diagnostic imaging of breast: Secondary | ICD-10-CM

## 2020-09-14 ENCOUNTER — Ambulatory Visit
Admission: RE | Admit: 2020-09-14 | Discharge: 2020-09-14 | Disposition: A | Discharge status: Home / Self Care | Payer: 59 | Source: Ambulatory Visit | Attending: Emergency Medicine | Admitting: Emergency Medicine

## 2020-09-14 ENCOUNTER — Other Ambulatory Visit: Payer: Self-pay

## 2020-09-14 DIAGNOSIS — R928 Other abnormal and inconclusive findings on diagnostic imaging of breast: Secondary | ICD-10-CM

## 2020-09-14 HISTORY — PX: BREAST BIOPSY: SHX20

## 2020-10-23 ENCOUNTER — Other Ambulatory Visit: Payer: Self-pay | Admitting: Surgery

## 2020-10-23 DIAGNOSIS — N6489 Other specified disorders of breast: Secondary | ICD-10-CM

## 2020-11-07 ENCOUNTER — Other Ambulatory Visit: Payer: Self-pay | Admitting: Surgery

## 2020-11-07 DIAGNOSIS — N6489 Other specified disorders of breast: Secondary | ICD-10-CM

## 2020-12-04 ENCOUNTER — Encounter (HOSPITAL_BASED_OUTPATIENT_CLINIC_OR_DEPARTMENT_OTHER): Payer: Self-pay | Admitting: Surgery

## 2020-12-04 ENCOUNTER — Other Ambulatory Visit: Payer: Self-pay

## 2020-12-07 ENCOUNTER — Other Ambulatory Visit (HOSPITAL_COMMUNITY)
Admission: RE | Admit: 2020-12-07 | Discharge: 2020-12-07 | Disposition: A | Discharge status: Home / Self Care | Payer: 59 | Source: Ambulatory Visit | Attending: Surgery | Admitting: Surgery

## 2020-12-07 DIAGNOSIS — Z01812 Encounter for preprocedural laboratory examination: Secondary | ICD-10-CM | POA: Diagnosis not present

## 2020-12-07 DIAGNOSIS — Z20822 Contact with and (suspected) exposure to covid-19: Secondary | ICD-10-CM | POA: Insufficient documentation

## 2020-12-07 LAB — SARS CORONAVIRUS 2 (TAT 6-24 HRS): SARS Coronavirus 2: NEGATIVE

## 2020-12-07 MED ORDER — ENSURE PRE-SURGERY PO LIQD: 296.0000 mL | Freq: Once | ORAL | Status: DC

## 2020-12-07 NOTE — Progress Notes (Signed)

## 2020-12-08 ENCOUNTER — Ambulatory Visit
Admission: RE | Admit: 2020-12-08 | Discharge: 2020-12-08 | Disposition: A | Discharge status: Home / Self Care | Payer: 59 | Source: Ambulatory Visit | Attending: Surgery | Admitting: Surgery

## 2020-12-08 ENCOUNTER — Other Ambulatory Visit: Payer: Self-pay

## 2020-12-08 DIAGNOSIS — N6489 Other specified disorders of breast: Secondary | ICD-10-CM

## 2020-12-08 NOTE — H&P (Signed)
Sherry Wheeler  Location: [facility] Patient #: [id number] DOB: 25-Mar-1966 Married / Language: English / Race: White Female   History of Present Illness The patient is a 55 year old female who presents with a breast mass.  Chief complaint: Complex sclerosing lesion right breast  This is a pleasant 55 year old female who is referred here after the recent diagnosis of a complex sclerosing lesion of the right breast. She had gone for screening mammographies. She then had follow-up films have distortion was seen in the right breast. She had multiple cysts in the left breast. At the 9:30 position of the right breast there is a small 6 mm lesion which was biopsied. This showed a complex sclerosing lesion. She has no previous problems with her breasts. There is no family history of breast cancer. She denies nipple discharge. She has asthma but is otherwise without complaints.   Past Surgical History  Cesarean Section - 1   Diagnostic Studies History Colonoscopy  1-5 years ago Mammogram  within last year Pap Smear  >5 years ago  Allergies Zoloft *ANTIDEPRESSANTS*  Allergies Reconciled   Medication History Advair Diskus (250-50MCG/DOSE Aero Pow Br Act, Inhalation) Active. Medications Reconciled  Social History Alcohol use  Occasional alcohol use. Caffeine use  Carbonated beverages, Tea. No drug use  Tobacco use  Former smoker.  Family History Arthritis  Father, Mother. Heart Disease  Father. Melanoma  Father. Prostate Cancer  Father. Respiratory Condition  Father.  Pregnancy / Birth History  Age at menarche  11 years. Gravida  3 Irregular periods  Length (months) of breastfeeding  12-24 Maternal age  10-25 Para  1  Other Problems  Asthma  Back Pain  Hemorrhoids     Review of Systems  General Not Present- Appetite Loss, Chills, Fatigue, Fever, Night Sweats, Weight Gain and Weight Loss. Skin Not Present- Change in  Wart/Mole, Dryness, Hives, Jaundice, New Lesions, Non-Healing Wounds, Rash and Ulcer. HEENT Present- Hearing Loss, Seasonal Allergies and Wears glasses/contact lenses. Not Present- Earache, Hoarseness, Nose Bleed, Oral Ulcers, Ringing in the Ears, Sinus Pain, Sore Throat, Visual Disturbances and Yellow Eyes. Respiratory Present- Difficulty Breathing and Snoring. Not Present- Bloody sputum, Chronic Cough and Wheezing. Breast Not Present- Breast Mass, Breast Pain, Nipple Discharge and Skin Changes. Gastrointestinal Present- Hemorrhoids. Not Present- Abdominal Pain, Bloating, Bloody Stool, Change in Bowel Habits, Chronic diarrhea, Constipation, Difficulty Swallowing, Excessive gas, Gets full quickly at meals, Indigestion, Nausea, Rectal Pain and Vomiting. Female Genitourinary Not Present- Frequency, Nocturia, Painful Urination, Pelvic Pain and Urgency. Musculoskeletal Present- Back Pain, Joint Pain and Joint Stiffness. Not Present- Muscle Pain, Muscle Weakness and Swelling of Extremities. Neurological Not Present- Decreased Memory, Fainting, Headaches, Numbness, Seizures, Tingling, Tremor, Trouble walking and Weakness. Psychiatric Present- Anxiety. Not Present- Bipolar, Change in Sleep Pattern, Depression, Fearful and Frequent crying. Endocrine Not Present- Cold Intolerance, Excessive Hunger, Hair Changes, Heat Intolerance, Hot flashes and New Diabetes. Hematology Present- Blood Thinners. Not Present- Easy Bruising, Excessive bleeding, Gland problems, HIV and Persistent Infections.  Vitals  Weight: 177 lb Height: 62in Body Surface Area: 1.81 m Body Mass Index: 32.37 kg/m  Temp.: 97.36F  Pulse: 103 (Regular)  BP: 124/76(Sitting, Left Arm, Standard)     Physical Exam  The physical exam findings are as follows: Note: She appears well exam  There is no palpable mass in the right breast. The nipple areola complex is normal. There is no axillary adenopathy.    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 I have reviewed her mammogram and ultrasound as well as her pathology results. She has a complex sclerosing lesion of the right breast. I gave her a copy of the pathology results. We discussed the diagnosis in detail. We discussed the reasoning for proceeding with surgery to remove this area for complete histologic evaluation to rule out malignancy. This will be with a right breast radioactive seed guided lumpectomy. I discussed the surgical procedure in detail. I discussed the risk which includes but is not limited to bleeding, infection, the need for further surgery malignancy is found, cardiopulmonary issues, postoperative recovery, etc. She understands and wishes to proceed with surgery which will be scheduled.

## 2020-12-11 ENCOUNTER — Ambulatory Visit
Admission: RE | Admit: 2020-12-11 | Discharge: 2020-12-11 | Disposition: A | Discharge status: Home / Self Care | Payer: 59 | Source: Ambulatory Visit | Attending: Surgery | Admitting: Surgery

## 2020-12-11 ENCOUNTER — Ambulatory Visit (HOSPITAL_BASED_OUTPATIENT_CLINIC_OR_DEPARTMENT_OTHER)
Admission: RE | Admit: 2020-12-11 | Discharge: 2020-12-11 | Disposition: A | Discharge status: Home / Self Care | Payer: 59 | Attending: Surgery | Admitting: Surgery

## 2020-12-11 ENCOUNTER — Ambulatory Visit (HOSPITAL_BASED_OUTPATIENT_CLINIC_OR_DEPARTMENT_OTHER): Payer: 59 | Admitting: Anesthesiology

## 2020-12-11 ENCOUNTER — Other Ambulatory Visit: Payer: Self-pay

## 2020-12-11 ENCOUNTER — Encounter (HOSPITAL_BASED_OUTPATIENT_CLINIC_OR_DEPARTMENT_OTHER)
Admission: RE | Disposition: A | Discharge status: Home / Self Care | Payer: Self-pay | Source: Home / Self Care | Attending: Surgery

## 2020-12-11 ENCOUNTER — Encounter (HOSPITAL_BASED_OUTPATIENT_CLINIC_OR_DEPARTMENT_OTHER): Payer: Self-pay | Admitting: Surgery

## 2020-12-11 DIAGNOSIS — Z7951 Long term (current) use of inhaled steroids: Secondary | ICD-10-CM | POA: Diagnosis not present

## 2020-12-11 DIAGNOSIS — Z888 Allergy status to other drugs, medicaments and biological substances status: Secondary | ICD-10-CM | POA: Diagnosis not present

## 2020-12-11 DIAGNOSIS — N6489 Other specified disorders of breast: Secondary | ICD-10-CM

## 2020-12-11 DIAGNOSIS — Z87891 Personal history of nicotine dependence: Secondary | ICD-10-CM | POA: Diagnosis not present

## 2020-12-11 HISTORY — PX: BREAST LUMPECTOMY WITH RADIOACTIVE SEED LOCALIZATION: SHX6424

## 2020-12-11 HISTORY — PX: BREAST EXCISIONAL BIOPSY: SUR124

## 2020-12-11 LAB — POCT PREGNANCY, URINE: Preg Test, Ur: NEGATIVE

## 2020-12-11 SURGERY — BREAST LUMPECTOMY WITH RADIOACTIVE SEED LOCALIZATION
Anesthesia: General | Site: Breast | Laterality: Right

## 2020-12-11 MED ORDER — FENTANYL CITRATE (PF) 100 MCG/2ML IJ SOLN
INTRAMUSCULAR | Status: DC | PRN
  Administered 2020-12-11: 50 ug via INTRAVENOUS

## 2020-12-11 MED ORDER — PROMETHAZINE HCL 25 MG/ML IJ SOLN: 6.2500 mg | INTRAMUSCULAR | Status: DC | PRN

## 2020-12-11 MED ORDER — TRAMADOL HCL 50 MG PO TABS: 50.0000 mg | ORAL_TABLET | Freq: Four times a day (QID) | ORAL | 1 refills | Status: DC | PRN

## 2020-12-11 MED ORDER — ONDANSETRON HCL 4 MG/2ML IJ SOLN
INTRAMUSCULAR | Status: DC | PRN
  Administered 2020-12-11: 4 mg via INTRAVENOUS

## 2020-12-11 MED ORDER — AMISULPRIDE (ANTIEMETIC) 5 MG/2ML IV SOLN: 10.0000 mg | Freq: Once | INTRAVENOUS | Status: DC | PRN

## 2020-12-11 MED ORDER — CHLORHEXIDINE GLUCONATE CLOTH 2 % EX PADS: 6.0000 | MEDICATED_PAD | Freq: Once | CUTANEOUS | Status: DC

## 2020-12-11 MED ORDER — OXYCODONE HCL 5 MG/5ML PO SOLN: 5.0000 mg | Freq: Once | ORAL | Status: AC | PRN

## 2020-12-11 MED ORDER — LACTATED RINGERS IV SOLN: INTRAVENOUS | Status: DC

## 2020-12-11 MED ORDER — GABAPENTIN 300 MG PO CAPS
300.0000 mg | ORAL_CAPSULE | ORAL | Status: AC
  Administered 2020-12-11: 300 mg via ORAL

## 2020-12-11 MED ORDER — OXYCODONE HCL 5 MG PO TABS
5.0000 mg | ORAL_TABLET | Freq: Once | ORAL | Status: AC | PRN
  Administered 2020-12-11: 5 mg via ORAL

## 2020-12-11 MED ORDER — MIDAZOLAM HCL 2 MG/2ML IJ SOLN
INTRAMUSCULAR | Status: AC
  Filled 2020-12-11: qty 2

## 2020-12-11 MED ORDER — ACETAMINOPHEN 500 MG PO TABS
ORAL_TABLET | ORAL | Status: AC
  Filled 2020-12-11: qty 2

## 2020-12-11 MED ORDER — DEXAMETHASONE SODIUM PHOSPHATE 4 MG/ML IJ SOLN
INTRAMUSCULAR | Status: DC | PRN
  Administered 2020-12-11: 4 mg via INTRAVENOUS

## 2020-12-11 MED ORDER — FENTANYL CITRATE (PF) 100 MCG/2ML IJ SOLN: 25.0000 ug | INTRAMUSCULAR | Status: DC | PRN

## 2020-12-11 MED ORDER — PROPOFOL 10 MG/ML IV BOLUS
INTRAVENOUS | Status: DC | PRN
  Administered 2020-12-11: 200 mg via INTRAVENOUS

## 2020-12-11 MED ORDER — PROPOFOL 500 MG/50ML IV EMUL
INTRAVENOUS | Status: DC | PRN
  Administered 2020-12-11: 25 ug/kg/min via INTRAVENOUS

## 2020-12-11 MED ORDER — BUPIVACAINE-EPINEPHRINE 0.5% -1:200000 IJ SOLN
INTRAMUSCULAR | Status: DC | PRN
  Administered 2020-12-11: 20 mL

## 2020-12-11 MED ORDER — GABAPENTIN 300 MG PO CAPS
ORAL_CAPSULE | ORAL | Status: AC
  Filled 2020-12-11: qty 1

## 2020-12-11 MED ORDER — CEFAZOLIN SODIUM-DEXTROSE 2-4 GM/100ML-% IV SOLN
INTRAVENOUS | Status: AC
  Filled 2020-12-11: qty 100

## 2020-12-11 MED ORDER — CELECOXIB 200 MG PO CAPS
ORAL_CAPSULE | ORAL | Status: AC
  Filled 2020-12-11: qty 2

## 2020-12-11 MED ORDER — CELECOXIB 200 MG PO CAPS
400.0000 mg | ORAL_CAPSULE | ORAL | Status: AC
  Administered 2020-12-11: 400 mg via ORAL

## 2020-12-11 MED ORDER — MIDAZOLAM HCL 5 MG/5ML IJ SOLN
INTRAMUSCULAR | Status: DC | PRN
  Administered 2020-12-11: 2 mg via INTRAVENOUS

## 2020-12-11 MED ORDER — LIDOCAINE HCL (CARDIAC) PF 100 MG/5ML IV SOSY
PREFILLED_SYRINGE | INTRAVENOUS | Status: DC | PRN
  Administered 2020-12-11: 60 mg via INTRAVENOUS

## 2020-12-11 MED ORDER — CEFAZOLIN SODIUM-DEXTROSE 2-4 GM/100ML-% IV SOLN
2.0000 g | INTRAVENOUS | Status: AC
  Administered 2020-12-11: 2 g via INTRAVENOUS

## 2020-12-11 MED ORDER — ACETAMINOPHEN 500 MG PO TABS
1000.0000 mg | ORAL_TABLET | ORAL | Status: AC
  Administered 2020-12-11: 1000 mg via ORAL

## 2020-12-11 MED ORDER — SCOPOLAMINE 1 MG/3DAYS TD PT72
MEDICATED_PATCH | TRANSDERMAL | Status: AC
  Filled 2020-12-11: qty 1

## 2020-12-11 MED ORDER — FENTANYL CITRATE (PF) 100 MCG/2ML IJ SOLN
INTRAMUSCULAR | Status: AC
  Filled 2020-12-11: qty 2

## 2020-12-11 MED ORDER — PHENYLEPHRINE HCL (PRESSORS) 10 MG/ML IV SOLN
INTRAVENOUS | Status: DC | PRN
Start: 2020-12-11 — End: 2020-12-11
  Administered 2020-12-11: 40 ug via INTRAVENOUS
  Administered 2020-12-11: 80 ug via INTRAVENOUS

## 2020-12-11 MED ORDER — SCOPOLAMINE 1 MG/3DAYS TD PT72
1.0000 | MEDICATED_PATCH | TRANSDERMAL | Status: DC
  Administered 2020-12-11: 1.5 mg via TRANSDERMAL

## 2020-12-11 MED ORDER — OXYCODONE HCL 5 MG PO TABS
ORAL_TABLET | ORAL | Status: AC
  Filled 2020-12-11: qty 1

## 2020-12-11 SURGICAL SUPPLY — 48 items
ADH SKN CLS APL DERMABOND .7 (GAUZE/BANDAGES/DRESSINGS) ×1
APL PRP STRL LF DISP 70% ISPRP (MISCELLANEOUS) ×1
APPLIER CLIP 9.375 MED OPEN (MISCELLANEOUS)
APR CLP MED 9.3 20 MLT OPN (MISCELLANEOUS)
BINDER BREAST 3XL (GAUZE/BANDAGES/DRESSINGS) IMPLANT
BINDER BREAST LRG (GAUZE/BANDAGES/DRESSINGS) IMPLANT
BINDER BREAST MEDIUM (GAUZE/BANDAGES/DRESSINGS) IMPLANT
BINDER BREAST XLRG (GAUZE/BANDAGES/DRESSINGS) IMPLANT
BINDER BREAST XXLRG (GAUZE/BANDAGES/DRESSINGS) IMPLANT
BLADE SURG 15 STRL LF DISP TIS (BLADE) ×1 IMPLANT
BLADE SURG 15 STRL SS (BLADE) ×2
CANISTER SUC SOCK COL 7IN (MISCELLANEOUS) IMPLANT
CANISTER SUCT 1200ML W/VALVE (MISCELLANEOUS) IMPLANT
CHLORAPREP W/TINT 26 (MISCELLANEOUS) ×2 IMPLANT
CLIP APPLIE 9.375 MED OPEN (MISCELLANEOUS) IMPLANT
COVER BACK TABLE 60X90IN (DRAPES) ×2 IMPLANT
COVER MAYO STAND STRL (DRAPES) ×2 IMPLANT
COVER PROBE W GEL 5X96 (DRAPES) ×2 IMPLANT
COVER WAND RF STERILE (DRAPES) IMPLANT
DECANTER SPIKE VIAL GLASS SM (MISCELLANEOUS) IMPLANT
DERMABOND ADVANCED (GAUZE/BANDAGES/DRESSINGS) ×1
DERMABOND ADVANCED .7 DNX12 (GAUZE/BANDAGES/DRESSINGS) ×1 IMPLANT
DRAPE LAPAROSCOPIC ABDOMINAL (DRAPES) ×2 IMPLANT
DRAPE UTILITY XL STRL (DRAPES) ×2 IMPLANT
ELECT REM PT RETURN 9FT ADLT (ELECTROSURGICAL) ×2
ELECTRODE REM PT RTRN 9FT ADLT (ELECTROSURGICAL) ×1 IMPLANT
GAUZE SPONGE 4X4 12PLY STRL LF (GAUZE/BANDAGES/DRESSINGS) IMPLANT
GLOVE SURG SIGNA 7.5 PF LTX (GLOVE) ×2 IMPLANT
GOWN STRL REUS W/ TWL LRG LVL3 (GOWN DISPOSABLE) ×1 IMPLANT
GOWN STRL REUS W/ TWL XL LVL3 (GOWN DISPOSABLE) ×1 IMPLANT
GOWN STRL REUS W/TWL LRG LVL3 (GOWN DISPOSABLE) ×2
GOWN STRL REUS W/TWL XL LVL3 (GOWN DISPOSABLE) ×2
KIT MARKER MARGIN INK (KITS) ×2 IMPLANT
NEEDLE HYPO 25X1 1.5 SAFETY (NEEDLE) ×2 IMPLANT
NS IRRIG 1000ML POUR BTL (IV SOLUTION) IMPLANT
PACK BASIN DAY SURGERY FS (CUSTOM PROCEDURE TRAY) ×2 IMPLANT
PENCIL SMOKE EVACUATOR (MISCELLANEOUS) ×2 IMPLANT
SLEEVE SCD COMPRESS KNEE MED (MISCELLANEOUS) ×2 IMPLANT
SPONGE LAP 4X18 RFD (DISPOSABLE) ×2 IMPLANT
SUT MNCRL AB 4-0 PS2 18 (SUTURE) ×2 IMPLANT
SUT SILK 2 0 SH (SUTURE) IMPLANT
SUT VIC AB 3-0 SH 27 (SUTURE) ×2
SUT VIC AB 3-0 SH 27X BRD (SUTURE) ×1 IMPLANT
SYR CONTROL 10ML LL (SYRINGE) ×2 IMPLANT
TOWEL GREEN STERILE FF (TOWEL DISPOSABLE) ×2 IMPLANT
TRAY FAXITRON CT DISP (TRAY / TRAY PROCEDURE) ×2 IMPLANT
TUBE CONNECTING 20X1/4 (TUBING) IMPLANT
YANKAUER SUCT BULB TIP NO VENT (SUCTIONS) IMPLANT

## 2020-12-11 NOTE — Op Note (Signed)
RIGHT BREAST LUMPECTOMY WITH RADIOACTIVE SEED LOCALIZATION  Procedure Note  MALEY VENEZIA 12/11/2020   Pre-op Diagnosis: COMPLEX SCLEROSING LESION RIGHT BREAST     Post-op Diagnosis: same  Procedure(s): RIGHT BREAST LUMPECTOMY WITH RADIOACTIVE SEED LOCALIZATION  Surgeon(s): Abigail Miyamoto, MD  Anesthesia: General  Staff:  Circulator: Raliegh Scarlet, RN Scrub Person: Verdie Drown  Estimated Blood Loss: Minimal               Specimens: sent to path  Procedure: The patient was brought to the operating room and identifies correct patient.  She is placed upon the operating table general anesthesia was induced.  Her right breast was prepped and draped in the usual sterile fashion.  A radioactive seed was located approximately 8 to 9 cm from the nipple at the 9:30 position of the right breast.  Anesthetized the skin at the lateral edge of the right breast near anterior axillary line with Marcaine.  I then made incision with a scalpel and dissected down to the breast tissue.  I next then dissected medially toward the radioactive seed with the aid of the neoprobe.  I was able to reach the area of the radioactive seed.  As I was retracting the specimen the seed came out so it was removed and placed in a cup.  I then completed the lumpectomy with the cautery.  I marked the margins with paint.  The specimen was x-rayed confirming the original tissue biopsy marker was in the specimen.  The specimen was then sent to pathology for evaluation.  I achieved hemostasis with the cautery.  I anesthetized the incision further with Marcaine.  I then closed the subcutaneous tissue with interrupted 3-0 Vicryl sutures and closed the skin with a running 4-0 Monocryl.  Dermabond was then applied.  The patient tolerated the procedure well.  All the counts were correct at the end of the procedure.  Patient was then extubated in the operating room and taken in stable condition to the recovery room.           Abigail Miyamoto   Date: 12/11/2020  Time: 9:12 AM

## 2020-12-11 NOTE — Anesthesia Preprocedure Evaluation (Signed)
Anesthesia Evaluation  Patient identified by MRN, date of birth, ID band Patient awake    Reviewed: Allergy & Precautions, H&P , NPO status , Patient's Chart, lab work & pertinent test results  Airway Mallampati: I  TM Distance: >3 FB Neck ROM: Full    Dental no notable dental hx.    Pulmonary asthma , former smoker,    Pulmonary exam normal breath sounds clear to auscultation       Cardiovascular negative cardio ROS Normal cardiovascular exam Rhythm:Regular Rate:Normal     Neuro/Psych negative neurological ROS  negative psych ROS   GI/Hepatic negative GI ROS, Neg liver ROS,   Endo/Other  negative endocrine ROS  Renal/GU negative Renal ROS  negative genitourinary   Musculoskeletal negative musculoskeletal ROS (+)   Abdominal   Peds negative pediatric ROS (+)  Hematology negative hematology ROS (+)   Anesthesia Other Findings   Reproductive/Obstetrics negative OB ROS                             Anesthesia Physical Anesthesia Plan  ASA: II  Anesthesia Plan: General   Post-op Pain Management:    Induction: Intravenous  PONV Risk Score and Plan: 3 and Scopolamine patch - Pre-op, Ondansetron, Dexamethasone and Midazolam  Airway Management Planned: LMA  Additional Equipment:   Intra-op Plan:   Post-operative Plan: Extubation in OR  Informed Consent: I have reviewed the patients History and Physical, chart, labs and discussed the procedure including the risks, benefits and alternatives for the proposed anesthesia with the patient or authorized representative who has indicated his/her understanding and acceptance.     Dental advisory given  Plan Discussed with: CRNA and Anesthesiologist  Anesthesia Plan Comments:         Anesthesia Quick Evaluation

## 2020-12-11 NOTE — Transfer of Care (Signed)
Immediate Anesthesia Transfer of Care Note  Patient: Sherry Wheeler  Procedure(s) Performed: RIGHT BREAST LUMPECTOMY WITH RADIOACTIVE SEED LOCALIZATION (Right Breast)  Patient Location: PACU  Anesthesia Type:General  Level of Consciousness: drowsy and patient cooperative  Airway & Oxygen Therapy: Patient Spontanous Breathing and Patient connected to face mask oxygen  Post-op Assessment: Report given to RN and Post -op Vital signs reviewed and stable  Post vital signs: Reviewed and stable  Last Vitals:  Vitals Value Taken Time  BP 108/56 12/11/20 0916  Temp    Pulse 82 12/11/20 0916  Resp 13 12/11/20 0916  SpO2 99 % 12/11/20 0916  Vitals shown include unvalidated device data.  Last Pain:  Vitals:   12/11/20 0734  TempSrc:   PainSc: 2       Patients Stated Pain Goal: 4 (12/11/20 0734)  Complications: No complications documented.

## 2020-12-11 NOTE — Anesthesia Procedure Notes (Signed)
Procedure Name: LMA Insertion Date/Time: 12/11/2020 8:35 AM Performed by: Sheryn Bison, CRNA Pre-anesthesia Checklist: Patient identified, Emergency Drugs available, Suction available and Patient being monitored Patient Re-evaluated:Patient Re-evaluated prior to induction Oxygen Delivery Method: Circle System Utilized Preoxygenation: Pre-oxygenation with 100% oxygen Induction Type: IV induction Ventilation: Mask ventilation without difficulty LMA: LMA inserted LMA Size: 4.0 Number of attempts: 1 Airway Equipment and Method: bite block Placement Confirmation: positive ETCO2 Tube secured with: Tape Dental Injury: Teeth and Oropharynx as per pre-operative assessment

## 2020-12-11 NOTE — Discharge Instructions (Signed)
[facility] Office Phone Number [phone]  BREAST BIOPSY/ PARTIAL MASTECTOMY: POST OP INSTRUCTIONS  Always review your discharge instruction sheet given to you by the facility where your surgery was performed.  IF YOU HAVE DISABILITY OR FAMILY LEAVE FORMS, YOU MUST BRING THEM TO THE OFFICE FOR PROCESSING.  DO NOT GIVE THEM TO YOUR DOCTOR.  1. A prescription for pain medication may be given to you upon discharge.  Take your pain medication as prescribed, if needed.  If narcotic pain medicine is not needed, then you may take acetaminophen (Tylenol) or ibuprofen (Advil) as needed. 2. Take your usually prescribed medications unless otherwise directed 3. If you need a refill on your pain medication, please contact your pharmacy.  They will contact our office to request authorization.  Prescriptions will not be filled after 5pm or on week-ends. 4. You should eat very light the first 24 hours after surgery, such as soup, crackers, pudding, etc.  Resume your normal diet the day after surgery. 5. Most patients will experience some swelling and bruising in the breast.  Ice packs and a good support bra will help.  Swelling and bruising can take several days to resolve.  6. It is common to experience some constipation if taking pain medication after surgery.  Increasing fluid intake and taking a stool softener will usually help or prevent this problem from occurring.  A mild laxative (Milk of Magnesia or Miralax) should be taken according to package directions if there are no bowel movements after 48 hours. 7. Unless discharge instructions indicate otherwise, you may remove your bandages 24-48 hours after surgery, and you may shower at that time.  You may have steri-strips (small skin tapes) in place directly over the incision.  These strips should be left on the skin for 7-10 days.  If your surgeon used skin glue on the incision, you may shower in 24 hours.  The glue will flake off over the  next 2-3 weeks.  Any sutures or staples will be removed at the office during your follow-up visit. 8. ACTIVITIES:  You may resume regular daily activities (gradually increasing) beginning the next day.  Wearing a good support bra or sports bra minimizes pain and swelling.  You may have sexual intercourse when it is comfortable. a. You may drive when you no longer are taking prescription pain medication, you can comfortably wear a seatbelt, and you can safely maneuver your car and apply brakes. b. RETURN TO WORK:  _1 TO 2 WEEKS c. _____________________________________________________________________________________ 9. You should see your doctor in the office for a follow-up appointment approximately two weeks after your surgery.  Your doctor's nurse will typically make your follow-up appointment when she calls you with your pathology report.  Expect your pathology report 2-3 business days after your surgery.  You may call to check if you do not hear from [place] after three days. 10. OTHER INSTRUCTIONS:OK TO SHOWER STARTING TOMORROW 11. ICE PACK, TYLENOL, AND IBUPROFEN ALSO FOR PAIN 12. NO VIGOROUS ACTIVITY FOR ONE WEEK _______________________________________________________________________________________________ _____________________________________________________________________________________________________________________________________ _____________________________________________________________________________________________________________________________________ _____________________________________________________________________________________________________________________________________  WHEN TO CALL YOUR DOCTOR: 1. Fever over 101.0 2. Nausea and/or vomiting. 3. Extreme swelling or bruising. 4. Continued bleeding from incision. 5. Increased pain, redness, or drainage from the incision.  The clinic staff is available to answer your questions during regular business hours.  Please  don't hesitate to call and ask to speak to one of the nurses for clinical concerns.  If you have a medical emergency, go to the nearest emergency room  or call 911.  A surgeon from [facility] is always on call at the hospital.  For further questions, please visit [email or web address]    Post Anesthesia Home Care Instructions  Activity: Get plenty of rest for the remainder of the day. A responsible individual must stay with you for 24 hours following the procedure.  For the next 24 hours, DO NOT: -Drive a car -Advertising copywriter -Drink alcoholic beverages -Take any medication unless instructed by your physician -Make any legal decisions or sign important papers.  Meals: Start with liquid foods such as gelatin or soup. Progress to regular foods as tolerated. Avoid greasy, spicy, heavy foods. If nausea and/or vomiting occur, drink only clear liquids until the nausea and/or vomiting subsides. Call your physician if vomiting continues.  Special Instructions/Symptoms: Your throat may feel dry or sore from the anesthesia or the breathing tube placed in your throat during surgery. If this causes discomfort, gargle with warm salt water. The discomfort should disappear within 24 hours.  If you had a scopolamine patch placed behind your ear for the management of post- operative nausea and/or vomiting:  1. The medication in the patch is effective for 72 hours, after which it should be removed.  Wrap patch in a tissue and discard in the trash. Wash hands thoroughly with soap and water. 2. You may remove the patch earlier than 72 hours if you experience unpleasant side effects which may include dry mouth, dizziness or visual disturbances. 3. Avoid touching the patch. Wash your hands with soap and water after contact with the patch.    No Tylenol until 1:40PM.

## 2020-12-11 NOTE — Anesthesia Postprocedure Evaluation (Signed)
Anesthesia Post Note  Patient: Sherry Wheeler  Procedure(s) Performed: RIGHT BREAST LUMPECTOMY WITH RADIOACTIVE SEED LOCALIZATION (Right Breast)     Patient location during evaluation: PACU Anesthesia Type: General Level of consciousness: awake Pain management: pain level controlled Vital Signs Assessment: post-procedure vital signs reviewed and stable Respiratory status: spontaneous breathing and respiratory function stable Cardiovascular status: stable Postop Assessment: no apparent nausea or vomiting Anesthetic complications: no   No complications documented.  Last Vitals:  Vitals:   12/11/20 0930 12/11/20 0945  BP: 101/61 121/72  Pulse: 96 79  Resp: 14 (!) 9  Temp:    SpO2: 100% 96%    Last Pain:  Vitals:   12/11/20 0945  TempSrc:   PainSc: 0-No pain                 Mellody Dance

## 2020-12-11 NOTE — Interval H&P Note (Signed)
History and Physical Interval Note: no change in H and P  12/11/2020 8:14 AM  [name]  has presented today for surgery, with the diagnosis of COMPLEX SCLEROSING LESION RIGHT BREAST.  The various methods of treatment have been discussed with the patient and family. After consideration of risks, benefits and other options for treatment, the patient has consented to  Procedure(s) with comments: RIGHT BREAST LUMPECTOMY WITH RADIOACTIVE SEED LOCALIZATION (Right) - LMA as a surgical intervention.  The patient's history has been reviewed, patient examined, no change in status, stable for surgery.  I have reviewed the patient's chart and labs.  Questions were answered to the patient's satisfaction.     Sherry Wheeler

## 2020-12-12 ENCOUNTER — Encounter (HOSPITAL_BASED_OUTPATIENT_CLINIC_OR_DEPARTMENT_OTHER): Payer: Self-pay | Admitting: Surgery

## 2020-12-14 LAB — SURGICAL PATHOLOGY

## 2021-04-21 IMAGING — MG MM PLC BREAST LOC DEV 1ST LESION INC*R*
8 series · 8 of 8 positions shown · non-contrast
Comparison: Previous exam(s).

CLINICAL DATA: RIGHT breast complex sclerosing lesion scheduled for
surgical excision requiring preoperative radioactive seed
localization.

EXAM:
MAMMOGRAPHIC GUIDED RADIOACTIVE SEED LOCALIZATION OF THE RIGHT
BREAST

[R ML]
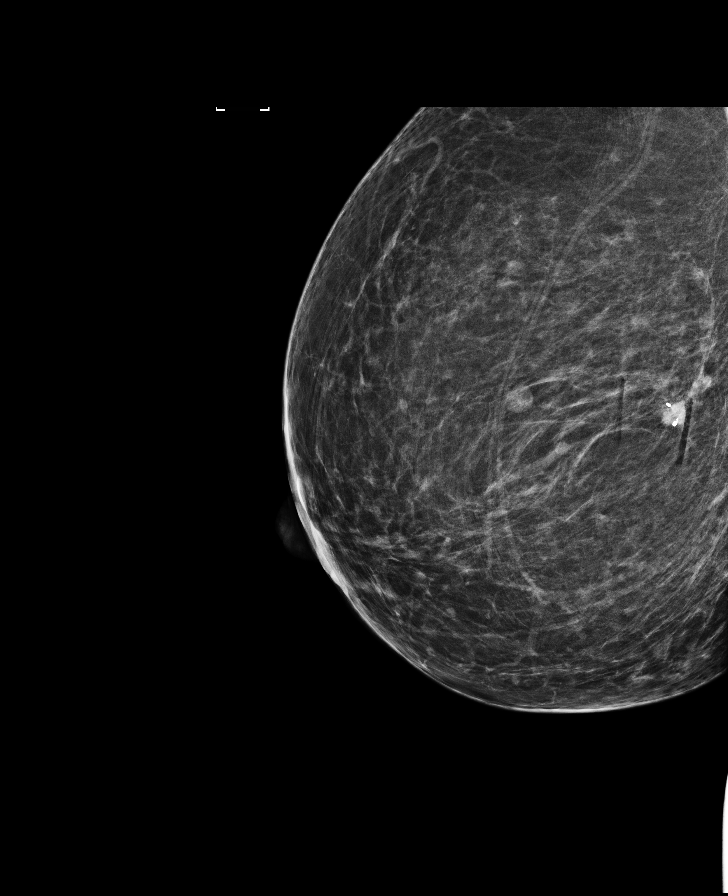

[R CC (1 of 4)]
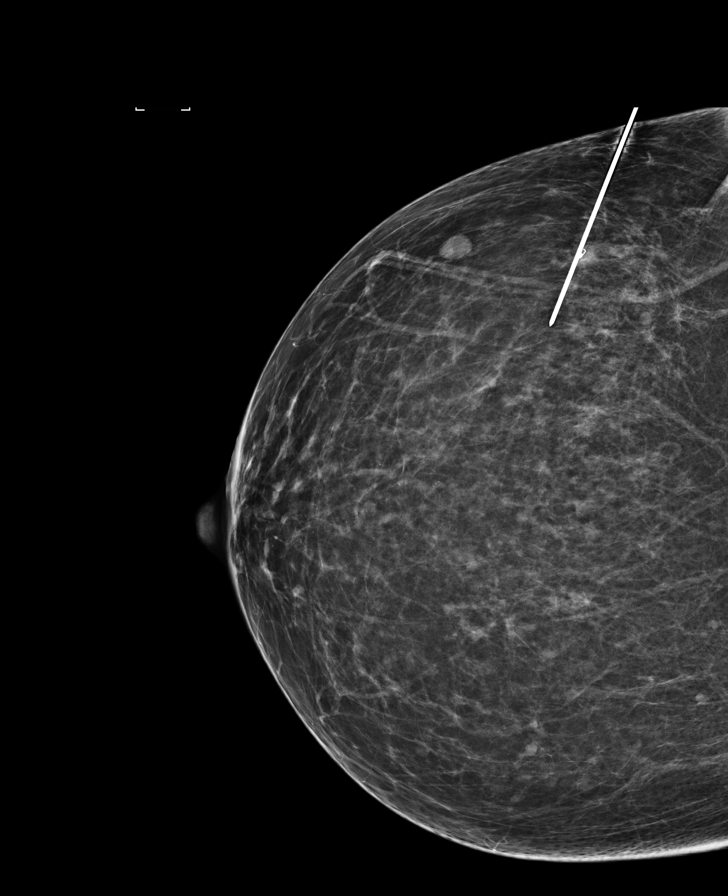

[R CC (2 of 4)]
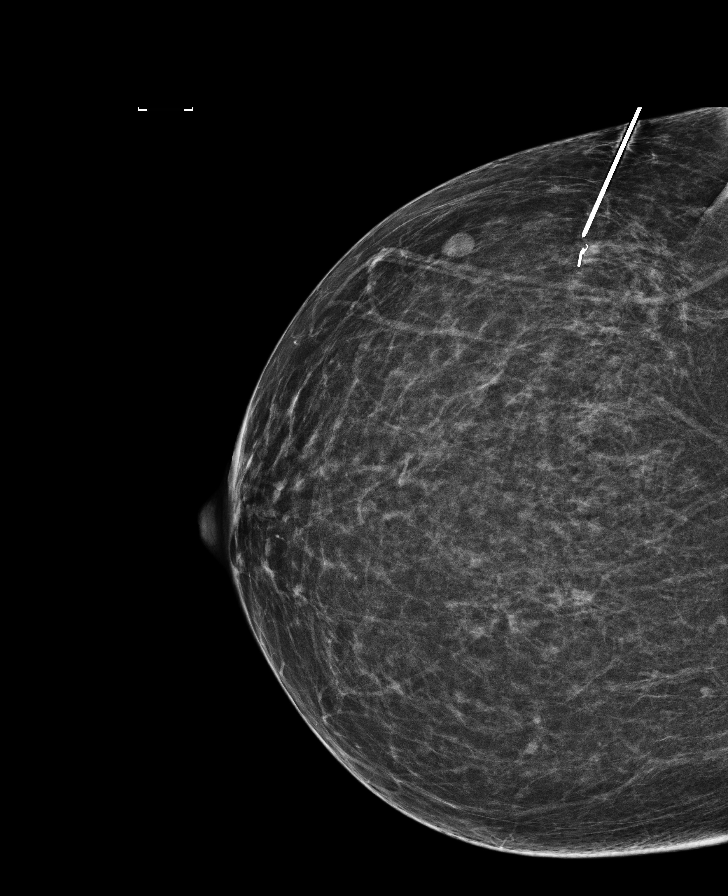

[R CC (3 of 4)]
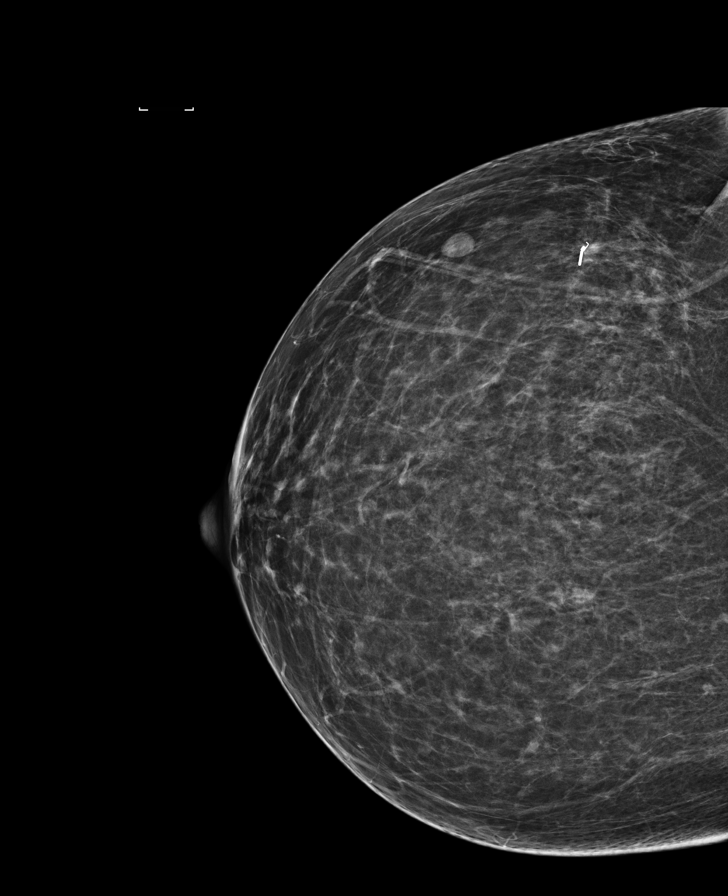

[R LM (1 of 3)]
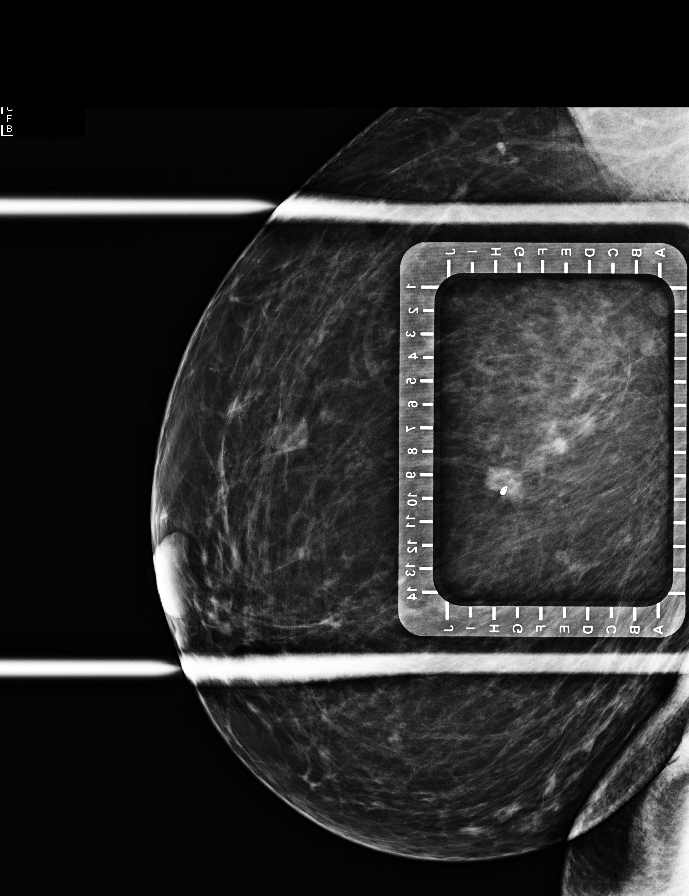

[R CC (4 of 4)]
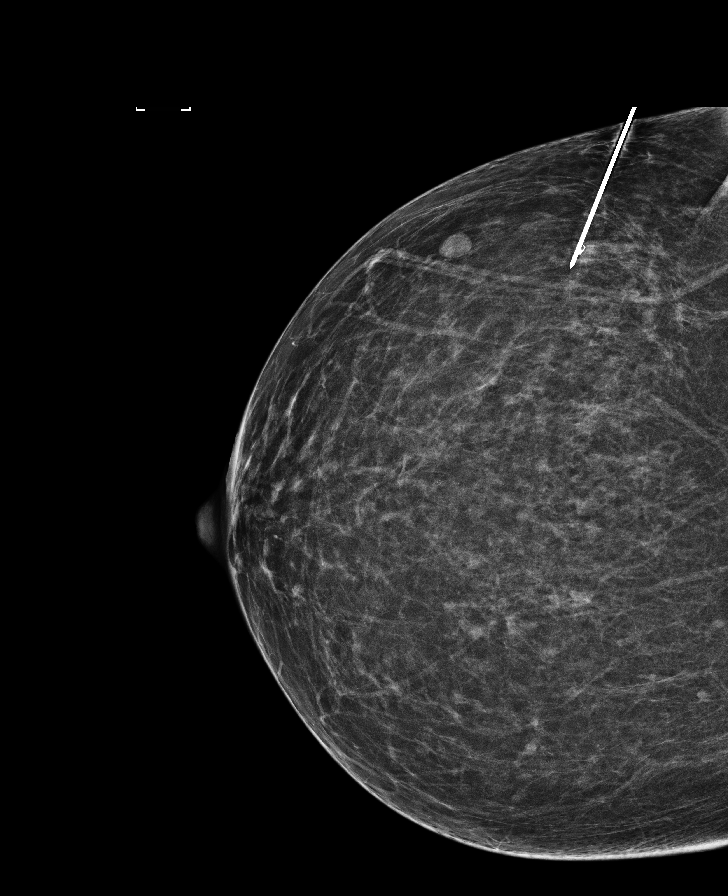

[R LM (2 of 3)]
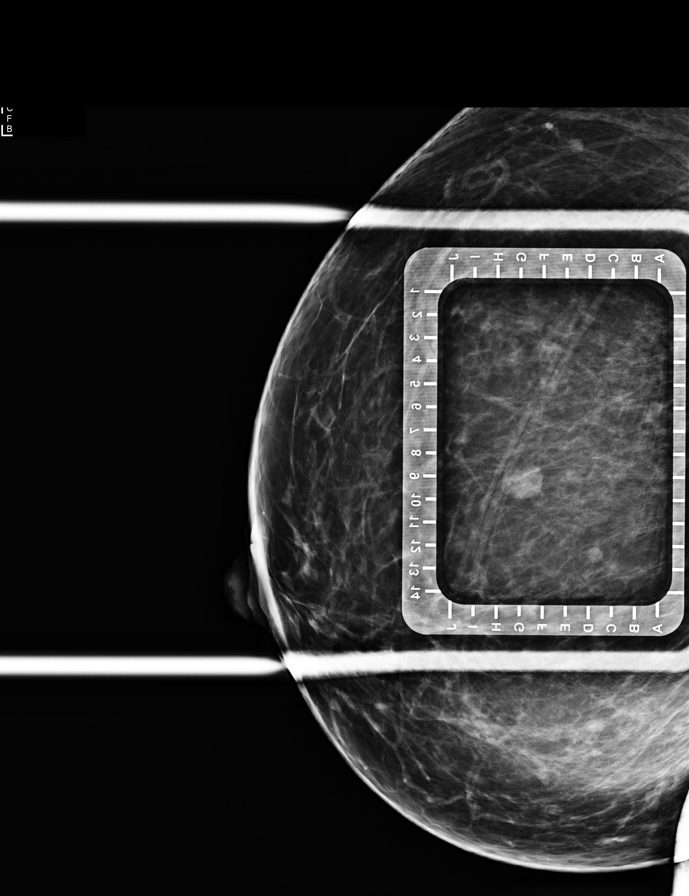

[R LM (3 of 3)]
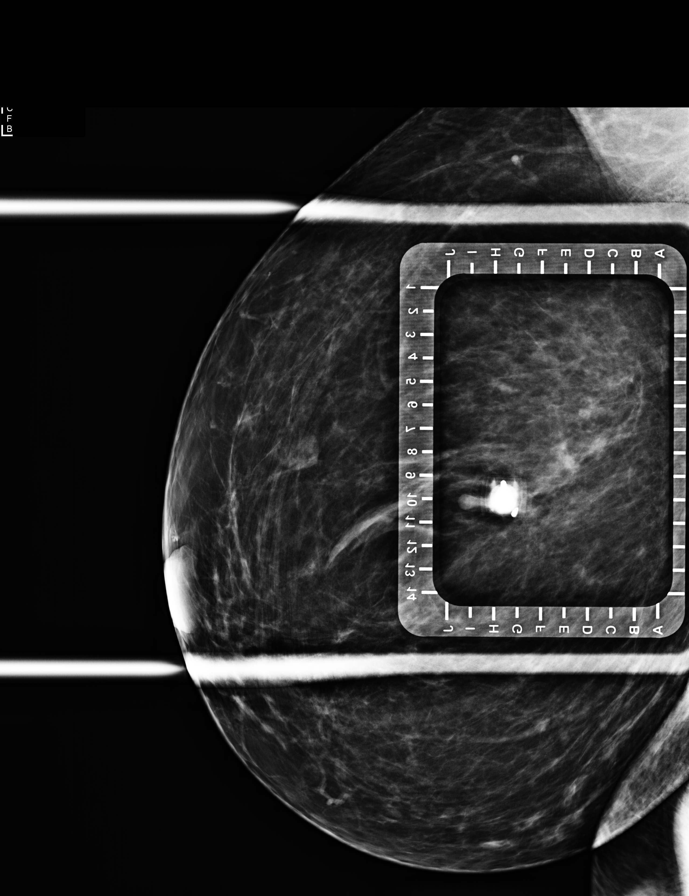

[8 of 8 positions shown; findings below may reference images not displayed]

FINDINGS: Patient presents for radioactive seed localization prior to surgical
excision. I met with the patient and we discussed the procedure of
seed localization including benefits and alternatives. We discussed
the high likelihood of a successful procedure. We discussed the
risks of the procedure including infection, bleeding, tissue injury
and further surgery. We discussed the low dose of radioactivity
involved in the procedure. Informed, written consent was given.

The usual time-out protocol was performed immediately prior to the
procedure.

Using mammographic guidance, sterile technique, 1% lidocaine and an
V-GDD radioactive seed, the coil shaped clip within the outer RIGHT
breast was localized using a lateral approach. The follow-up
mammogram images confirm the seed in the expected location and were
marked for Dr. Evripidies.

Follow-up survey of the patient confirms presence of the radioactive
seed.

Order number of V-GDD seed:  [id number].

Total activity:  0.253 millicuries reference Date: 11/21/2020

The patient tolerated the procedure well and was released from the
[REDACTED]. She was given instructions regarding seed removal.
IMPRESSION: Radioactive seed localization RIGHT breast. No apparent
complications.

## 2021-05-07 ENCOUNTER — Encounter: Payer: Self-pay | Admitting: Family

## 2021-05-07 ENCOUNTER — Ambulatory Visit (INDEPENDENT_AMBULATORY_CARE_PROVIDER_SITE_OTHER): Payer: 59 | Admitting: Family

## 2021-05-07 ENCOUNTER — Other Ambulatory Visit: Payer: Self-pay

## 2021-05-07 VITALS — BP 130/88 | HR 96 | Temp 97.5°F | Resp 18 | Ht 62.0 in | Wt 181.2 lb

## 2021-05-07 DIAGNOSIS — Z7689 Persons encountering health services in other specified circumstances: Secondary | ICD-10-CM | POA: Diagnosis not present

## 2021-05-07 DIAGNOSIS — E6609 Other obesity due to excess calories: Secondary | ICD-10-CM

## 2021-05-07 DIAGNOSIS — J4520 Mild intermittent asthma, uncomplicated: Secondary | ICD-10-CM | POA: Diagnosis not present

## 2021-05-07 DIAGNOSIS — Z113 Encounter for screening for infections with a predominantly sexual mode of transmission: Secondary | ICD-10-CM

## 2021-05-07 DIAGNOSIS — Z6833 Body mass index (BMI) 33.0-33.9, adult: Secondary | ICD-10-CM

## 2021-05-07 DIAGNOSIS — Z1159 Encounter for screening for other viral diseases: Secondary | ICD-10-CM | POA: Diagnosis not present

## 2021-05-07 NOTE — Patient Instructions (Signed)
https://www.nhlbi.nih.gov/files/docs/public/heart/dash_brief.pdf">  DASH Eating Plan DASH stands for Dietary Approaches to Stop Hypertension. The DASH eating plan is a healthy eating plan that has been shown to: Reduce high blood pressure (hypertension). Reduce your risk for type 2 diabetes, heart disease, and stroke. Help with weight loss. What are tips for following this plan? Reading food labels Check food labels for the amount of salt (sodium) per serving. Choose foods with less than 5 percent of the Daily Value of sodium. Generally, foods with less than 300 milligrams (mg) of sodium per serving fit into this eating plan. To find whole grains, look for the word "whole" as the first word in the ingredient list. Shopping Buy products labeled as "low-sodium" or "no salt added." Buy fresh foods. Avoid canned foods and pre-made or frozen meals. Cooking Avoid adding salt when cooking. Use salt-free seasonings or herbs instead of table salt or sea salt. Check with your health care provider or pharmacist before using salt substitutes. Do not fry foods. Cook foods using healthy methods such as baking, boiling, grilling, roasting, and broiling instead. Cook with heart-healthy oils, such as olive, canola, avocado, soybean, or sunflower oil. Meal planning  Eat a balanced diet that includes: 4 or more servings of fruits and 4 or more servings of vegetables each day. Try to fill one-half of your plate with fruits and vegetables. 6-8 servings of whole grains each day. Less than 6 oz (170 g) of lean meat, poultry, or fish each day. A 3-oz (85-g) serving of meat is about the same size as a deck of cards. One egg equals 1 oz (28 g). 2-3 servings of low-fat dairy each day. One serving is 1 cup (237 mL). 1 serving of nuts, seeds, or beans 5 times each week. 2-3 servings of heart-healthy fats. Healthy fats called omega-3 fatty acids are found in foods such as walnuts, flaxseeds, fortified milks, and eggs.  These fats are also found in cold-water fish, such as sardines, salmon, and mackerel. Limit how much you eat of: Canned or prepackaged foods. Food that is high in trans fat, such as some fried foods. Food that is high in saturated fat, such as fatty meat. Desserts and other sweets, sugary drinks, and other foods with added sugar. Full-fat dairy products. Do not salt foods before eating. Do not eat more than 4 egg yolks a week. Try to eat at least 2 vegetarian meals a week. Eat more home-cooked food and less restaurant, buffet, and fast food.  Lifestyle When eating at a restaurant, ask that your food be prepared with less salt or no salt, if possible. If you drink alcohol: Limit how much you use to: 0-1 drink a day for women who are not pregnant. 0-2 drinks a day for men. Be aware of how much alcohol is in your drink. In the [place] one drink equals one 12 oz bottle of beer (355 mL), one 5 oz glass of wine (148 mL), or one 1 oz glass of hard liquor (44 mL). General information Avoid eating more than 2,300 mg of salt a day. If you have hypertension, you may need to reduce your sodium intake to 1,500 mg a day. Work with your health care provider to maintain a healthy body weight or to lose weight. Ask what an ideal weight is for you. Get at least 30 minutes of exercise that causes your heart to beat faster (aerobic exercise) most days of the week. Activities may include walking, swimming, or biking. Work with your health care provider   or dietitian to adjust your eating plan to your individual calorie needs. What foods should I eat? Fruits All fresh, dried, or frozen fruit. Canned fruit in natural juice (without addedsugar). Vegetables Fresh or frozen vegetables (raw, steamed, roasted, or grilled). Low-sodium or reduced-sodium tomato and vegetable juice. Low-sodium or reduced-sodium tomatosauce and tomato paste. Low-sodium or reduced-sodium canned vegetables. Grains Whole-grain or  whole-wheat bread. Whole-grain or whole-wheat pasta. Brown rice. Oatmeal. Quinoa. Bulgur. Whole-grain and low-sodium cereals. Pita bread.Low-fat, low-sodium crackers. Whole-wheat flour tortillas. Meats and other proteins Skinless chicken or turkey. Ground chicken or turkey. Pork with fat trimmed off. Fish and seafood. Egg whites. Dried beans, peas, or lentils. Unsalted nuts, nut butters, and seeds. Unsalted canned beans. Lean cuts of beef with fat trimmed off. Low-sodium, lean precooked or cured meat, such as sausages or meatloaves. Dairy Low-fat (1%) or fat-free (skim) milk. Reduced-fat, low-fat, or fat-free cheeses. Nonfat, low-sodium ricotta or cottage cheese. Low-fat or nonfatyogurt. Low-fat, low-sodium cheese. Fats and oils Soft margarine without trans fats. Vegetable oil. Reduced-fat, low-fat, or light mayonnaise and salad dressings (reduced-sodium). Canola, safflower, olive, avocado, soybean, andsunflower oils. Avocado. Seasonings and condiments Herbs. Spices. Seasoning mixes without salt. Other foods Unsalted popcorn and pretzels. Fat-free sweets. The items listed above may not be a complete list of foods and beverages you can eat. Contact a dietitian for more information. What foods should I avoid? Fruits Canned fruit in a light or heavy syrup. Fried fruit. Fruit in cream or buttersauce. Vegetables Creamed or fried vegetables. Vegetables in a cheese sauce. Regular canned vegetables (not low-sodium or reduced-sodium). Regular canned tomato sauce and paste (not low-sodium or reduced-sodium). Regular tomato and vegetable juice(not low-sodium or reduced-sodium). Pickles. Olives. Grains Baked goods made with fat, such as croissants, muffins, or some breads. Drypasta or rice meal packs. Meats and other proteins Fatty cuts of meat. Ribs. Fried meat. Bacon. Bologna, salami, and other precooked or cured meats, such as sausages or meat loaves. Fat from the back of a pig (fatback). Bratwurst.  Salted nuts and seeds. Canned beans with added salt. Canned orsmoked fish. Whole eggs or egg yolks. Chicken or turkey with skin. Dairy Whole or 2% milk, cream, and half-and-half. Whole or full-fat cream cheese. Whole-fat or sweetened yogurt. Full-fat cheese. Nondairy creamers. Whippedtoppings. Processed cheese and cheese spreads. Fats and oils Butter. Stick margarine. Lard. Shortening. Ghee. Bacon fat. Tropical oils, suchas coconut, palm kernel, or palm oil. Seasonings and condiments Onion salt, garlic salt, seasoned salt, table salt, and sea salt. Worcestershire sauce. Tartar sauce. Barbecue sauce. Teriyaki sauce. Soy sauce, including reduced-sodium. Steak sauce. Canned and packaged gravies. Fish sauce. Oyster sauce. Cocktail sauce. Store-bought horseradish. Ketchup. Mustard. Meat flavorings and tenderizers. Bouillon cubes. Hot sauces. Pre-made or packaged marinades. Pre-made or packaged taco seasonings. Relishes. Regular saladdressings. Other foods Salted popcorn and pretzels. The items listed above may not be a complete list of foods and beverages you should avoid. Contact a dietitian for more information. Where to find more information National Heart, Lung, and Blood Institute: www.nhlbi.nih.gov American Heart Association: www.heart.org Academy of Nutrition and Dietetics: www.eatright.org National Kidney Foundation: www.kidney.org Summary The DASH eating plan is a healthy eating plan that has been shown to reduce high blood pressure (hypertension). It may also reduce your risk for type 2 diabetes, heart disease, and stroke. When on the DASH eating plan, aim to eat more fresh fruits and vegetables, whole grains, lean proteins, low-fat dairy, and heart-healthy fats. With the DASH eating plan, you should limit salt (sodium) intake to 2,300   mg a day. If you have hypertension, you may need to reduce your sodium intake to 1,500 mg a day. Work with your health care provider or dietitian to adjust  your eating plan to your individual calorie needs. This information is not intended to replace advice given to you by your health care provider. Make sure you discuss any questions you have with your healthcare provider. Document Revised: 10/08/2019 Document Reviewed: 10/08/2019 Elsevier Patient Education  2022 Elsevier Inc.  

## 2021-05-07 NOTE — Progress Notes (Signed)
Provider: Marlowe Sax FNP-C   Ngetich, Nelda Bucks, NP  Patient Care Team: Ngetich, Nelda Bucks, NP as PCP - General (Family Medicine)  Extended Emergency Contact Information Primary Emergency Contact: [facility] Address: [place],  [place] Home Phone: [phone] Relation: None Secondary Emergency Contact: Bello,Rachel Mobile Phone: [phone] Relation: Daughter  Code Status:  Full Code  Goals of care: Advanced Directive information Advanced Directives 05/07/2021  Does Patient Have a Medical Advance Directive? No  Would patient like information on creating a medical advance directive? No - Patient declined     Chief Complaint  Patient presents with   Establish Care    New Patient.    HPI:  Pt is a 55 y.o. female seen today to establish care for medical management of chronic diseases. Has medical history of Asthma on Albuterol and Advair inhaler.States has not required her albuterol as often. Reports no wheezing or shortness of breath.  She quit smoking 8 yrs ago used to smoke cigarettes  1 pack per day on and off for about 20 yrs.Now Vapes.  She drinks coffee. Does not exercise but works as a Programme researcher, broadcasting/film/video so walking constantly.    Past Medical History:  Diagnosis Date   Allergy    Asthma    History of colonoscopy    History of mammogram    Past Surgical History:  Procedure Laterality Date   BREAST LUMPECTOMY WITH RADIOACTIVE SEED LOCALIZATION Right 12/11/2020   Procedure: RIGHT BREAST LUMPECTOMY WITH RADIOACTIVE SEED LOCALIZATION;  Surgeon: Coralie Keens, MD;  Location: [facility];  Service: General;  Laterality: Right;  LMA   CESAREAN SECTION      Allergies  Allergen Reactions   Zoloft [Sertraline Hcl] Rash    Allergies as of 05/07/2021       Reactions   Zoloft [sertraline Hcl] Rash        Medication List        Accurate as of May 07, 2021  1:59 PM. If you have any questions, ask your nurse or  doctor.          STOP taking these medications    clotrimazole 1 % cream Commonly known as: LOTRIMIN Stopped by: Otis Peak, CMA       TAKE these medications    albuterol 108 (90 Base) MCG/ACT inhaler Commonly known as: Ventolin HFA INHALE TWO PUFFS BY MOUTH EVERY 4 HOURS AS NEEDED FOR WHEEZING.   ALLERGY PO Take by mouth as needed.   Fluticasone-Salmeterol 250-50 MCG/DOSE Aepb Commonly known as: ADVAIR INHALE 1 DOSE BY MOUTH TWICE DAILY   ibuprofen 200 MG tablet Commonly known as: ADVIL Take 200 mg by mouth in the morning, at noon, in the evening, and at bedtime.   MIDOL PO Take by mouth as needed. What changed: Another medication with the same name was removed. Continue taking this medication, and follow the directions you see here. Changed by: Otis Peak, CMA   OVER THE COUNTER MEDICATION Take 1 capsule by mouth daily. Over 55 Multivitamin   OVER THE COUNTER MEDICATION Take by mouth as needed. Cold & Sinus   SUPER B COMPLEX PO Take 1 capsule by mouth daily.   traMADol 50 MG tablet Commonly known as: ULTRAM Take 1 tablet (50 mg total) by mouth every 6 (six) hours as needed.   Vitamin D3 50 MCG (2000 UT) Tabs Take 1 tablet by mouth daily.  Review of Systems  Constitutional:  Negative for appetite change, chills, fatigue, fever and unexpected weight change.  HENT:  Negative for congestion, dental problem, ear discharge, ear pain, facial swelling, hearing loss, nosebleeds, postnasal drip, rhinorrhea, sinus pressure, sinus pain, sneezing, sore throat, tinnitus and trouble swallowing.   Eyes:  Negative for pain, discharge, redness, itching and visual disturbance.  Respiratory:  Negative for cough, chest tightness, shortness of breath and wheezing.   Cardiovascular:  Negative for chest pain, palpitations and leg swelling.  Gastrointestinal:  Negative for abdominal distention, abdominal pain, blood in stool, constipation, diarrhea, nausea  and vomiting.  Endocrine: Negative for cold intolerance, heat intolerance, polydipsia, polyphagia and polyuria.  Genitourinary:  Negative for difficulty urinating, dysuria, flank pain, frequency and urgency.  Musculoskeletal:  Positive for back pain. Negative for arthralgias, gait problem, joint swelling, myalgias, neck pain and neck stiffness.       Does massage once a month for muscle relaxation   Skin:  Negative for color change, pallor, rash and wound.  Neurological:  Negative for dizziness, syncope, speech difficulty, weakness, light-headedness, numbness and headaches.  Hematological:  Does not bruise/bleed easily.  Psychiatric/Behavioral:  Negative for agitation, behavioral problems, confusion, hallucinations, self-injury, sleep disturbance and suicidal ideas. The patient is not nervous/anxious.        Sleeps 5-6 hrs at night.    Immunization History  Administered Date(s) Administered   Influenza-Unspecified 08/25/2019   Pertinent  Health Maintenance Due  Topic Date Due   PAP SMEAR-Modifier  09/11/2018   INFLUENZA VACCINE  06/18/2021   MAMMOGRAM  09/04/2022   COLONOSCOPY (Pts 45-58yr Insurance coverage will need to be confirmed)  01/13/2027   Fall Risk  05/07/2021 12/20/2019 01/26/2018 11/25/2016 09/12/2015  Falls in the past year? 0 0 No No No  Number falls in past yr: 0 - - - -  Injury with Fall? 0 - - - -  Risk for fall due to : No Fall Risks - - - -  Follow up - Falls evaluation completed - - -   Functional Status Survey:    Vitals:   05/07/21 1330  BP: 130/88  Pulse: 96  Resp: 18  Temp: (!) 97.5 F (36.4 C)  SpO2: 96%  Weight: 181 lb 3.2 oz (82.2 kg)  Height: 5' 2" (1.575 m)   Body mass index is 33.14 kg/m. Physical Exam Vitals reviewed.  Constitutional:      General: She is not in acute distress.    Appearance: Normal appearance. She is normal weight. She is not ill-appearing or diaphoretic.  HENT:     Head: Normocephalic.     Right Ear: Tympanic membrane,  ear canal and external ear normal. There is no impacted cerumen.     Left Ear: Tympanic membrane, ear canal and external ear normal. There is no impacted cerumen.     Nose: Nose normal. No congestion or rhinorrhea.     Mouth/Throat:     Mouth: Mucous membranes are moist.     Pharynx: Oropharynx is clear. No oropharyngeal exudate or posterior oropharyngeal erythema.  Eyes:     General: No scleral icterus.       Right eye: No discharge.        Left eye: No discharge.     Extraocular Movements: Extraocular movements intact.     Conjunctiva/sclera: Conjunctivae normal.     Pupils: Pupils are equal, round, and reactive to light.  Neck:     Vascular: No carotid bruit.  Cardiovascular:  Rate and Rhythm: Normal rate and regular rhythm.     Pulses: Normal pulses.     Heart sounds: Normal heart sounds. No murmur heard.   No friction rub. No gallop.  Pulmonary:     Effort: Pulmonary effort is normal. No respiratory distress.     Breath sounds: Normal breath sounds. No wheezing, rhonchi or rales.  Chest:     Chest wall: No tenderness.  Abdominal:     General: Bowel sounds are normal. There is no distension.     Palpations: Abdomen is soft. There is no mass.     Tenderness: There is no abdominal tenderness. There is no right CVA tenderness, left CVA tenderness, guarding or rebound.  Musculoskeletal:        General: No swelling or tenderness. Normal range of motion.     Cervical back: Normal range of motion. No rigidity or tenderness.     Right lower leg: No edema.     Left lower leg: No edema.  Lymphadenopathy:     Cervical: No cervical adenopathy.  Skin:    General: Skin is warm and dry.     Coloration: Skin is not pale.     Findings: No bruising, erythema, lesion or rash.  Neurological:     Mental Status: She is alert and oriented to person, place, and time.     Cranial Nerves: No cranial nerve deficit.     Sensory: No sensory deficit.     Motor: No weakness.     Coordination:  Coordination normal.     Gait: Gait normal.  Psychiatric:        Mood and Affect: Mood normal.        Speech: Speech normal.        Behavior: Behavior normal.        Thought Content: Thought content normal.        Judgment: Judgment normal.    Labs reviewed: No results for input(s): NA, K, CL, CO2, GLUCOSE, BUN, CREATININE, CALCIUM, MG, PHOS in the last 8760 hours. No results for input(s): AST, ALT, ALKPHOS, BILITOT, PROT, ALBUMIN in the last 8760 hours. No results for input(s): WBC, NEUTROABS, HGB, HCT, MCV, PLT in the last 8760 hours. Lab Results  Component Value Date   TSH 1.098 09/12/2015   Lab Results  Component Value Date   HGBA1C 5.4 09/12/2015   Lab Results  Component Value Date   CHOL 207 (H) 09/12/2015   HDL 59 09/12/2015   LDLCALC 122 09/12/2015   TRIG 128 09/12/2015   CHOLHDL 3.5 09/12/2015    Significant Diagnostic Results in last 30 days:  No results found.  Assessment/Plan  1. Encounter to establish care General health good. - immunization reviewed unclear when she had Tdap vaccine will verify records then up date as indicated. - CBC with Differential/Platelet; Future - CMP with eGFR([facility]); Future - TSH; Future - Lipid panel; Future  2. Mild intermittent asthma without complication Symptoms controlled. - continue on albuterol and Advair  - CBC with Differential/Platelet; Future - CMP with eGFR([facility]); Future  3. Encounter for hepatitis C screening test for low risk patient Reports low risk. - Hep C Antibody; Future  4. Screen for STD (sexually transmitted disease) Reports no high risk behaviors.  - HIV antibody (with reflex); Future  5. Body mass index (BMI) of 33.0-33.9 in adult BMI 33.14  Dietary modification and exercise  6. Class 1 obesity due to excess calories without serious comorbidity with body mass index (BMI) of 33.0 to  33.9 in adult Dietary and exercise modification    Family/ staff Communication: Reviewed plan of care with  patient verbalized understanding   Labs/tests ordered:  - CBC with Differential/Platelet - CMP with eGFR([facility]) - TSH - Hgb A1C - Lipid panel - Hep C Antibody - HIV Antibody (routine testing w rflx)  Next Appointment : 1 year for Annual Physical Examination   Sandrea Hughs, NP

## 2021-05-14 ENCOUNTER — Other Ambulatory Visit: Payer: Self-pay

## 2021-05-14 ENCOUNTER — Other Ambulatory Visit: Payer: PRIVATE HEALTH INSURANCE

## 2021-05-14 DIAGNOSIS — J4520 Mild intermittent asthma, uncomplicated: Secondary | ICD-10-CM

## 2021-05-14 DIAGNOSIS — Z1159 Encounter for screening for other viral diseases: Secondary | ICD-10-CM

## 2021-05-14 DIAGNOSIS — Z113 Encounter for screening for infections with a predominantly sexual mode of transmission: Secondary | ICD-10-CM

## 2021-05-14 DIAGNOSIS — Z7689 Persons encountering health services in other specified circumstances: Secondary | ICD-10-CM

## 2021-05-15 LAB — LIPID PANEL
Cholesterol: 178 mg/dL (ref <200)
HDL: 59 mg/dL (ref >=50)
LDL Cholesterol (Calc): 94 mg/dL (calc)
Non-HDL Cholesterol (Calc): 119 mg/dL (calc) (ref <130)
Total CHOL/HDL Ratio: 3 (calc) (ref <5.0)
Triglycerides: 156 mg/dL — ABNORMAL HIGH (ref <150)

## 2021-05-15 LAB — CBC WITH DIFFERENTIAL/PLATELET
Absolute Monocytes: 574 cells/uL (ref 200–950)
Basophils Absolute: 70 cells/uL (ref 0–200)
Basophils Relative: 1 %
Eosinophils Absolute: 294 cells/uL (ref 15–500)
Eosinophils Relative: 4.2 %
HCT: 41.5 % (ref 35.0–45.0)
Hemoglobin: 13.7 g/dL (ref 11.7–15.5)
Lymphs Abs: 2359 cells/uL (ref 850–3900)
MCH: 31 pg (ref 27.0–33.0)
MCHC: 33 g/dL (ref 32.0–36.0)
MCV: 93.9 fL (ref 80.0–100.0)
MPV: 9.2 fL (ref 7.5–12.5)
Monocytes Relative: 8.2 %
Neutro Abs: 3703 cells/uL (ref 1500–7800)
Neutrophils Relative %: 52.9 %
Platelets: 288 10*3/uL (ref 140–400)
RBC: 4.42 10*6/uL (ref 3.80–5.10)
RDW: 12.7 % (ref 11.0–15.0)
Total Lymphocyte: 33.7 %
WBC: 7 10*3/uL (ref 3.8–10.8)

## 2021-05-15 LAB — COMPLETE METABOLIC PANEL WITH GFR
AG Ratio: 1.8 (calc) (ref 1.0–2.5)
ALT: 25 U/L (ref 6–29)
AST: 17 U/L (ref 10–35)
Albumin: 4.4 g/dL (ref 3.6–5.1)
Alkaline phosphatase (APISO): 48 U/L (ref 37–153)
BUN: 19 mg/dL (ref 7–25)
CO2: 27 mmol/L (ref 20–32)
Calcium: 9.3 mg/dL (ref 8.6–10.4)
Chloride: 106 mmol/L (ref 98–110)
Creat: 0.8 mg/dL (ref 0.50–1.05)
GFR, Est African American: 96 mL/min/{1.73_m2} (ref >=60)
GFR, Est Non African American: 83 mL/min/{1.73_m2} (ref >=60)
Globulin: 2.4 g/dL (calc) (ref 1.9–3.7)
Glucose, Bld: 96 mg/dL (ref 65–99)
Potassium: 3.8 mmol/L (ref 3.5–5.3)
Sodium: 141 mmol/L (ref 135–146)
Total Bilirubin: 0.5 mg/dL (ref 0.2–1.2)
Total Protein: 6.8 g/dL (ref 6.1–8.1)

## 2021-05-15 LAB — HEPATITIS C ANTIBODY
Hepatitis C Ab: NONREACTIVE
SIGNAL TO CUT-OFF: 0.01 (ref <1.00)

## 2021-05-15 LAB — TSH: TSH: 1.24 mIU/L

## 2021-05-15 LAB — HIV ANTIBODY (ROUTINE TESTING W REFLEX): HIV 1&2 Ab, 4th Generation: NONREACTIVE

## 2021-06-04 ENCOUNTER — Other Ambulatory Visit: Payer: Self-pay | Admitting: *Deleted

## 2021-06-04 MED ORDER — FLUTICASONE-SALMETEROL 250-50 MCG/ACT IN AEPB: 1.0000 | INHALATION_SPRAY | Freq: Two times a day (BID) | RESPIRATORY_TRACT | 1 refills | Status: DC

## 2021-06-04 NOTE — Telephone Encounter (Signed)
Patient requested refill

## 2021-06-15 ENCOUNTER — Other Ambulatory Visit: Payer: Self-pay | Admitting: *Deleted

## 2021-06-15 NOTE — Telephone Encounter (Signed)
Patient called and stated that she received a letter from her insurance stating that her Rx's needed to be 90 day supplies.   Patient wanted to call and inform us. Stated that her insurance is [place].  Told her to remind [place] when she calls for refill. Agreed.

## 2021-08-07 ENCOUNTER — Other Ambulatory Visit: Payer: Self-pay | Admitting: *Deleted

## 2021-08-07 MED ORDER — FLUTICASONE-SALMETEROL 250-50 MCG/ACT IN AEPB: 1.0000 | INHALATION_SPRAY | Freq: Two times a day (BID) | RESPIRATORY_TRACT | 1 refills | Status: DC

## 2021-08-07 NOTE — Telephone Encounter (Signed)
Patient requested refill

## 2022-03-12 ENCOUNTER — Ambulatory Visit (INDEPENDENT_AMBULATORY_CARE_PROVIDER_SITE_OTHER): Payer: 59 | Admitting: Family

## 2022-03-12 ENCOUNTER — Encounter (HOSPITAL_COMMUNITY): Payer: Self-pay

## 2022-03-12 ENCOUNTER — Encounter: Payer: Self-pay | Admitting: Family

## 2022-03-12 VITALS — BP 140/88 | HR 87 | Temp 97.6°F | Resp 18 | Ht 62.0 in | Wt 186.8 lb

## 2022-03-12 DIAGNOSIS — R509 Fever, unspecified: Secondary | ICD-10-CM

## 2022-03-12 DIAGNOSIS — R52 Pain, unspecified: Secondary | ICD-10-CM | POA: Diagnosis not present

## 2022-03-12 DIAGNOSIS — R051 Acute cough: Secondary | ICD-10-CM

## 2022-03-12 DIAGNOSIS — R0981 Nasal congestion: Secondary | ICD-10-CM

## 2022-03-12 DIAGNOSIS — J0100 Acute maxillary sinusitis, unspecified: Secondary | ICD-10-CM

## 2022-03-12 DIAGNOSIS — R059 Cough, unspecified: Secondary | ICD-10-CM

## 2022-03-12 MED ORDER — AZITHROMYCIN 250 MG PO TABS: ORAL_TABLET | ORAL | 0 refills | Status: DC

## 2022-03-12 MED ORDER — CLOTRIMAZOLE-BETAMETHASONE 1-0.05 % EX CREA: 1.0000 "application " | TOPICAL_CREAM | Freq: Every day | CUTANEOUS | 0 refills | Status: DC

## 2022-03-12 NOTE — Progress Notes (Signed)
? ?Provider: Richarda Bladeinah Colen Eltzroth FNP-C ? ?Jamare Vanatta, Donalee Citrininah C, NP ? ?Patient Care Team: ?Tanelle Lanzo, Donalee Citrininah C, NP as PCP - General (Family Medicine) ? ?Extended Emergency Contact Information ?Primary Emergency Contact: Pilger,RICHARD ?Address: [address] ?         [place],  [id number] ?Home Phone: [phone] ?Relation: None ?Secondary Emergency Contact: Mucci,Rachel ?Mobile Phone: [phone] ?Relation: Daughter ? ?Code Status: Full Code  ?Goals of care: Advanced Directive information ? ?  03/12/2022  ?  1:18 PM  ?Advanced Directives  ?Does Patient Have a Medical Advance Directive? No  ?Would patient like information on creating a medical advance directive? No - Patient declined  ? ? ? ?Chief Complaint  ?Patient presents with  ? Acute Visit  ?  Patient complains of low grade fever, nasal congestion, cough, and body aches X 2-3 days.  ? ? ?HPI:  ?Pt is a 56 y.o. female seen today for an acute visit for evaluation of low-grade fever, nasal congestion,cough and generalized body aches x 2- 3 days.denies any contact with sick person with COVID-19.Cough described as nonproductive.She denies any fatigue,chest tightness,chest pain,palpitation or shortness of breath. ? ?Past Medical History:  ?Diagnosis Date  ? Allergy   ? Asthma   ? History of colonoscopy   ? History of mammogram   ? ?Past Surgical History:  ?Procedure Laterality Date  ? BREAST LUMPECTOMY WITH RADIOACTIVE SEED LOCALIZATION Right 12/11/2020  ? Procedure: RIGHT BREAST LUMPECTOMY WITH RADIOACTIVE SEED LOCALIZATION;  Surgeon: Abigail MiyamotoBlackman, Douglas, MD;  Location: [facility];  Service: General;  Laterality: Right;  LMA  ? CESAREAN SECTION    ? ? ?Allergies  ?Allergen Reactions  ? Zoloft [Sertraline Hcl] Rash  ? ? ?Outpatient Encounter Medications as of 03/12/2022  ?Medication Sig  ? Acetaminophen (MIDOL PO) Take by mouth as needed.  ? albuterol (VENTOLIN HFA) 108 (90 Base) MCG/ACT inhaler INHALE TWO PUFFS BY MOUTH EVERY 4 HOURS AS NEEDED FOR WHEEZING.   ? B Complex-C (SUPER B COMPLEX PO) Take 1 capsule by mouth daily.  ? Chlorpheniramine Maleate (ALLERGY PO) Take by mouth as needed.  ? Cholecalciferol (VITAMIN D3) 50 MCG (2000 UT) TABS Take 1 tablet by mouth daily.  ? fluticasone-salmeterol (ADVAIR) 250-50 MCG/ACT AEPB Inhale 1 puff into the lungs in the morning and at bedtime.  ? ibuprofen (ADVIL) 200 MG tablet Take 200 mg by mouth in the morning, at noon, in the evening, and at bedtime.  ? OVER THE COUNTER MEDICATION Take 1 capsule by mouth daily. Over 55 Multivitamin  ? OVER THE COUNTER MEDICATION Take by mouth as needed. Cold & Sinus  ? traMADol (ULTRAM) 50 MG tablet Take 1 tablet (50 mg total) by mouth every 6 (six) hours as needed.  ? ?No facility-administered encounter medications on file as of 03/12/2022.  ? ? ?Review of Systems  ?Constitutional:  Positive for fever.  ?     Generalized body aches   ?HENT:  Positive for congestion.   ?Respiratory:  Positive for cough.   ? ?Immunization History  ?Administered Date(s) Administered  ? Influenza-Unspecified 08/25/2019  ? PFIZER(Purple Top)SARS-COV-2 Vaccination 01/27/2020, 02/17/2020, 12/01/2020, 11/23/2021  ? ?Pertinent  Health Maintenance Due  ?Topic Date Due  ? PAP SMEAR-Modifier  09/11/2018  ? INFLUENZA VACCINE  06/18/2022  ? MAMMOGRAM  09/04/2022  ? COLONOSCOPY (Pts 45-6480yrs Insurance coverage will need to be confirmed)  01/13/2027  ? ? ?  01/26/2018  ?  4:54 PM 12/20/2019  ?  2:43 PM 12/11/2020  ?  7:34 AM  05/07/2021  ?  1:31 PM 03/12/2022  ?  1:18 PM  ?Fall Risk  ?Falls in the past year? No 0  0 0  ?Was there an injury with Fall?    0 0  ?Fall Risk Category Calculator    0 0  ?Fall Risk Category    Low Low  ?Patient Fall Risk Level   Low fall risk Low fall risk Low fall risk  ?Patient at Risk for Falls Due to    No Fall Risks No Fall Risks  ?Fall risk Follow up  Falls evaluation completed   Falls evaluation completed  ? ?Functional Status Survey: ?  ? ?Vitals:  ? 03/12/22 1314  ?BP: 140/88  ?Pulse: 87  ?Resp:  18  ?Temp: 97.6 ?F (36.4 ?C)  ?SpO2: 96%  ?Weight: 186 lb 12.8 oz (84.7 kg)  ?Height: 5\' 2"  (1.575 m)  ? ?Body mass index is 34.17 kg/m? ?Physical Exam ?Constitutional:   ?   General: She is not in acute distress. ?   Appearance: She is not ill-appearing.  ?HENT:  ?   Head: Normocephalic.  ?   Right Ear: Tympanic membrane, ear canal and external ear normal. There is no impacted cerumen.  ?   Left Ear: Tympanic membrane, ear canal and external ear normal. There is no impacted cerumen.  ?   Nose: Congestion and rhinorrhea present.  ?   Right Turbinates: Not enlarged or swollen.  ?   Left Turbinates: Not enlarged or swollen.  ?   Right Sinus: Maxillary sinus tenderness present. No frontal sinus tenderness.  ?   Left Sinus: Maxillary sinus tenderness present. No frontal sinus tenderness.  ?   Mouth/Throat:  ?   Mouth: Mucous membranes are moist.  ?   Pharynx: Oropharynx is clear. No oropharyngeal exudate or posterior oropharyngeal erythema.  ?Eyes:  ?   General: No scleral icterus.    ?   Right eye: No discharge.     ?   Left eye: No discharge.  ?   Extraocular Movements: Extraocular movements intact.  ?   Conjunctiva/sclera: Conjunctivae normal.  ?   Pupils: Pupils are equal, round, and reactive to light.  ?Neck:  ?   Vascular: No carotid bruit.  ?Cardiovascular:  ?   Rate and Rhythm: Normal rate and regular rhythm.  ?   Pulses: Normal pulses.  ?   Heart sounds: Normal heart sounds. No murmur heard. ?  No friction rub. No gallop.  ?Pulmonary:  ?   Effort: Pulmonary effort is normal. No respiratory distress.  ?   Breath sounds: Normal breath sounds. No wheezing, rhonchi or rales.  ?Chest:  ?   Chest wall: No tenderness.  ?Abdominal:  ?   General: Bowel sounds are normal. There is no distension.  ?   Palpations: Abdomen is soft. There is no mass.  ?   Tenderness: There is no abdominal tenderness. There is no guarding or rebound.  ?Musculoskeletal:     ?   General: No swelling or tenderness. Normal range of motion.  ?    Cervical back: No rigidity or tenderness.  ?   Right lower leg: No edema.  ?   Left lower leg: No edema.  ?Lymphadenopathy:  ?   Cervical: No cervical adenopathy.  ?Skin: ?   General: Skin is warm and dry.  ?   Coloration: Skin is not pale.  ?   Findings: No erythema or rash.  ?Neurological:  ?   Mental Status: She is  alert and oriented to person, place, and time.  ?   Sensory: No sensory deficit.  ?   Motor: No weakness.  ?   Gait: Gait normal.  ?Psychiatric:     ?   Mood and Affect: Mood normal.     ?   Behavior: Behavior normal.  ? ? ?Labs reviewed: ?Recent Labs  ?  05/14/21 ?1003  ?NA 141  ?K 3.8  ?CL 106  ?CO2 27  ?GLUCOSE 96  ?BUN 19  ?CREATININE 0.80  ?CALCIUM 9.3  ? ?Recent Labs  ?  05/14/21 ?1003  ?AST 17  ?ALT 25  ?BILITOT 0.5  ?PROT 6.8  ? ?Recent Labs  ?  05/14/21 ?1003  ?WBC 7.0  ?NEUTROABS 3,703  ?HGB 13.7  ?HCT 41.5  ?MCV 93.9  ?PLT 288  ? ?Lab Results  ?Component Value Date  ? TSH 1.24 05/14/2021  ? ?Lab Results  ?Component Value Date  ? HGBA1C 5.4 09/12/2015  ? ?Lab Results  ?Component Value Date  ? CHOL 178 05/14/2021  ? HDL 59 05/14/2021  ? LDLCALC 94 05/14/2021  ? TRIG 156 (H) 05/14/2021  ? CHOLHDL 3.0 05/14/2021  ? ? ?Significant Diagnostic Results in last 30 days:  ?No results found. ? ?Assessment/Plan ?1. Cough, acute  ?Suspect irritation from post nasal drip.Bilateral lung clear to auscultation ?-Over-the-counter Robitussin as needed ?We will screen for COVID-19 ?- SARS-COV-2 RNA,(COVID-19) QUAL NAAT ? ?2. Acute non recurrent sinusitis  ?Bilateral maxillary sinus tender to percussion ?-Start on Z-Pak ?-Encouraged to increase fluid intake ?- SARS-COV-2 RNA,(COVID-19) QUAL NAAT ? ?3. Body aches ?-Take over the counter Tylenol as needed for body aches ?- SARS-COV-2 RNA,(COVID-19) QUAL NAAT ? ?4. Low grade fever ?Reports low-grade fever at home.  Temperature within normal range during visit but took Tylenol prior to visit. ?- SARS-COV-2 RNA,(COVID-19) QUAL NAAT ? ?Family/ staff Communication:  Reviewed plan of care with patient verbalized understanding ? ?Labs/tests ordered:- SARS-COV-2 RNA,(COVID-19) QUAL NAAT ? ?Next Appointment: As needed if symptoms worsen or fail to improve ? ?Daysean Tinkham C Nge

## 2022-03-13 LAB — SARS-COV-2 RNA,(COVID-19) QUALITATIVE NAAT: SARS CoV2 RNA: NOT DETECTED

## 2022-07-29 NOTE — Progress Notes (Unsigned)
Provider: Marlowe Sax FNP-C  Eyvette Cordon, Nelda Bucks, NP  Patient Care Team: Kasi Lasky, Nelda Bucks, NP as PCP - General (Family Medicine)  Extended Emergency Contact Information Primary Emergency Contact: [facility] Address: [place],  [place] Home Phone: [phone] Relation: None Secondary Emergency Contact: Opfer,Rachel Mobile Phone: [phone] Relation: Daughter  Code Status:  Full code  Goals of care: Advanced Directive information    03/12/2022    1:18 PM  Advanced Directives  Does Patient Have a Medical Advance Directive? No  Would patient like information on creating a medical advance directive? No - Patient declined     No chief complaint on file.   HPI:  Pt is a 56 y.o. female seen today for an acute visit for medication discussion Advair.  Has a significant medical history of asthma currently on Advair and albuterol.   Past Medical History:  Diagnosis Date   Allergy    Asthma    History of colonoscopy    History of mammogram    Past Surgical History:  Procedure Laterality Date   BREAST LUMPECTOMY WITH RADIOACTIVE SEED LOCALIZATION Right 12/11/2020   Procedure: RIGHT BREAST LUMPECTOMY WITH RADIOACTIVE SEED LOCALIZATION;  Surgeon: Coralie Keens, MD;  Location: [facility];  Service: General;  Laterality: Right;  LMA   CESAREAN SECTION      Allergies  Allergen Reactions   Zoloft [Sertraline Hcl] Rash    Outpatient Encounter Medications as of 07/30/2022  Medication Sig   Acetaminophen (MIDOL PO) Take by mouth as needed.   albuterol (VENTOLIN HFA) 108 (90 Base) MCG/ACT inhaler INHALE TWO PUFFS BY MOUTH EVERY 4 HOURS AS NEEDED FOR WHEEZING.   azithromycin (ZITHROMAX) 250 MG tablet Take 2 tablets (500 mg ) by mouth x 1 dose then one tablet ( 250 mg ) by mouth daily x 4 days   B Complex-C (SUPER B COMPLEX PO) Take 1 capsule by mouth daily.   Chlorpheniramine Maleate (ALLERGY PO) Take by mouth as needed.    Cholecalciferol (VITAMIN D3) 50 MCG (2000 UT) TABS Take 1 tablet by mouth daily.   clotrimazole-betamethasone (LOTRISONE) cream Apply 1 application. topically daily.   fluticasone-salmeterol (ADVAIR) 250-50 MCG/ACT AEPB Inhale 1 puff into the lungs in the morning and at bedtime.   ibuprofen (ADVIL) 200 MG tablet Take 200 mg by mouth in the morning, at noon, in the evening, and at bedtime.   OVER THE COUNTER MEDICATION Take 1 capsule by mouth daily. Over 55 Multivitamin   OVER THE COUNTER MEDICATION Take by mouth as needed. Cold & Sinus   traMADol (ULTRAM) 50 MG tablet Take 1 tablet (50 mg total) by mouth every 6 (six) hours as needed.   No facility-administered encounter medications on file as of 07/30/2022.    Review of Systems  Constitutional:  Negative for appetite change, chills, fatigue, fever and unexpected weight change.  HENT:  Negative for congestion, dental problem, ear discharge, ear pain, facial swelling, hearing loss, nosebleeds, postnasal drip, rhinorrhea, sinus pressure, sinus pain, sneezing, sore throat, tinnitus and trouble swallowing.   Eyes:  Negative for pain, discharge, redness, itching and visual disturbance.  Respiratory:  Negative for cough, chest tightness, shortness of breath and wheezing.   Cardiovascular:  Negative for chest pain, palpitations and leg swelling.  Gastrointestinal:  Negative for abdominal distention, abdominal pain, blood in stool, constipation, diarrhea, nausea and vomiting.  Endocrine: Negative for cold intolerance, heat intolerance, polydipsia, polyphagia and polyuria.  Genitourinary:  Negative for difficulty urinating, dysuria, flank pain, frequency and urgency.  Musculoskeletal:  Negative for arthralgias, back pain, gait problem, joint swelling, myalgias, neck pain and neck stiffness.  Skin:  Negative for color change, pallor, rash and wound.  Neurological:  Negative for dizziness, syncope, speech difficulty, weakness, light-headedness, numbness  and headaches.  Hematological:  Does not bruise/bleed easily.  Psychiatric/Behavioral:  Negative for agitation, behavioral problems, confusion, hallucinations, self-injury, sleep disturbance and suicidal ideas. The patient is not nervous/anxious.     Immunization History  Administered Date(s) Administered   Influenza-Unspecified 08/25/2019   PFIZER(Purple Top)SARS-COV-2 Vaccination 01/27/2020, 02/17/2020, 12/01/2020, 11/23/2021   Pertinent  Health Maintenance Due  Topic Date Due   PAP SMEAR-Modifier  09/11/2018   INFLUENZA VACCINE  06/18/2022   MAMMOGRAM  09/04/2022   COLONOSCOPY (Pts 45-17yrs Insurance coverage will need to be confirmed)  01/13/2027      01/26/2018    4:54 PM 12/20/2019    2:43 PM 12/11/2020    7:34 AM 05/07/2021    1:31 PM 03/12/2022    1:18 PM  Fall Risk  Falls in the past year? No 0  0 0  Was there an injury with Fall?    0 0  Fall Risk Category Calculator    0 0  Fall Risk Category    Low Low  Patient Fall Risk Level   Low fall risk Low fall risk Low fall risk  Patient at Risk for Falls Due to    No Fall Risks No Fall Risks  Fall risk Follow up  Falls evaluation completed   Falls evaluation completed   Functional Status Survey:    There were no vitals filed for this visit. There is no height or weight on file to calculate BMI. Physical Exam Vitals reviewed.  Constitutional:      General: She is not in acute distress.    Appearance: Normal appearance. She is normal weight. She is not ill-appearing or diaphoretic.  HENT:     Head: Normocephalic.     Right Ear: Tympanic membrane, ear canal and external ear normal. There is no impacted cerumen.     Left Ear: Tympanic membrane, ear canal and external ear normal. There is no impacted cerumen.     Nose: Nose normal. No congestion or rhinorrhea.     Mouth/Throat:     Mouth: Mucous membranes are moist.     Pharynx: Oropharynx is clear. No oropharyngeal exudate or posterior oropharyngeal erythema.  Eyes:      General: No scleral icterus.       Right eye: No discharge.        Left eye: No discharge.     Extraocular Movements: Extraocular movements intact.     Conjunctiva/sclera: Conjunctivae normal.     Pupils: Pupils are equal, round, and reactive to light.  Neck:     Vascular: No carotid bruit.  Cardiovascular:     Rate and Rhythm: Normal rate and regular rhythm.     Pulses: Normal pulses.     Heart sounds: Normal heart sounds. No murmur heard.    No friction rub. No gallop.  Pulmonary:     Effort: Pulmonary effort is normal. No respiratory distress.     Breath sounds: Normal breath sounds. No wheezing, rhonchi or rales.  Chest:     Chest wall: No tenderness.  Abdominal:     General: Bowel sounds are normal. There is no distension.     Palpations: Abdomen is soft. There is no mass.  Tenderness: There is no abdominal tenderness. There is no right CVA tenderness, left CVA tenderness, guarding or rebound.  Musculoskeletal:        General: No swelling or tenderness. Normal range of motion.     Cervical back: Normal range of motion. No rigidity or tenderness.     Right lower leg: No edema.     Left lower leg: No edema.  Lymphadenopathy:     Cervical: No cervical adenopathy.  Skin:    General: Skin is warm and dry.     Coloration: Skin is not pale.     Findings: No bruising, erythema, lesion or rash.  Neurological:     Mental Status: She is alert and oriented to person, place, and time.     Cranial Nerves: No cranial nerve deficit.     Sensory: No sensory deficit.     Motor: No weakness.     Coordination: Coordination normal.     Gait: Gait normal.  Psychiatric:        Mood and Affect: Mood normal.        Speech: Speech normal.        Behavior: Behavior normal.        Thought Content: Thought content normal.        Judgment: Judgment normal.     Labs reviewed: No results for input(s): "NA", "K", "CL", "CO2", "GLUCOSE", "BUN", "CREATININE", "CALCIUM", "MG", "PHOS" in the  last 8760 hours. No results for input(s): "AST", "ALT", "ALKPHOS", "BILITOT", "PROT", "ALBUMIN" in the last 8760 hours. No results for input(s): "WBC", "NEUTROABS", "HGB", "HCT", "MCV", "PLT" in the last 8760 hours. Lab Results  Component Value Date   TSH 1.24 05/14/2021   Lab Results  Component Value Date   HGBA1C 5.4 09/12/2015   Lab Results  Component Value Date   CHOL 178 05/14/2021   HDL 59 05/14/2021   LDLCALC 94 05/14/2021   TRIG 156 (H) 05/14/2021   CHOLHDL 3.0 05/14/2021    Significant Diagnostic Results in last 30 days:  No results found.  Assessment/Plan 1. Mild intermittent asthma without complication ***    Family/ staff Communication: Reviewed plan of care with patient verbalized understanding  Labs/tests ordered: None   Next Appointment: As needed if symptoms worsen or fail to improve    Caesar Bookman, NP

## 2022-07-30 ENCOUNTER — Other Ambulatory Visit: Payer: Managed Care, Other (non HMO)

## 2022-07-30 ENCOUNTER — Encounter: Payer: Self-pay | Admitting: Family

## 2022-07-30 ENCOUNTER — Ambulatory Visit (INDEPENDENT_AMBULATORY_CARE_PROVIDER_SITE_OTHER): Payer: Managed Care, Other (non HMO) | Admitting: Family

## 2022-07-30 VITALS — BP 130/80 | HR 89 | Temp 98.4°F | Resp 16 | Ht 62.0 in | Wt 187.2 lb

## 2022-07-30 DIAGNOSIS — Z6834 Body mass index (BMI) 34.0-34.9, adult: Secondary | ICD-10-CM | POA: Diagnosis not present

## 2022-07-30 DIAGNOSIS — J4520 Mild intermittent asthma, uncomplicated: Secondary | ICD-10-CM | POA: Diagnosis not present

## 2022-07-30 DIAGNOSIS — E6609 Other obesity due to excess calories: Secondary | ICD-10-CM

## 2022-07-30 DIAGNOSIS — Z23 Encounter for immunization: Secondary | ICD-10-CM | POA: Insufficient documentation

## 2022-07-30 DIAGNOSIS — E781 Pure hyperglyceridemia: Secondary | ICD-10-CM | POA: Diagnosis not present

## 2022-07-30 MED ORDER — FLUTICASONE-SALMETEROL 250-50 MCG/ACT IN AEPB: 1.0000 | INHALATION_SPRAY | Freq: Two times a day (BID) | RESPIRATORY_TRACT | 1 refills | Status: DC

## 2022-07-30 MED ORDER — ALBUTEROL SULFATE HFA 108 (90 BASE) MCG/ACT IN AERS: INHALATION_SPRAY | RESPIRATORY_TRACT | 2 refills | Status: DC

## 2022-07-30 NOTE — Assessment & Plan Note (Signed)
-   Stable -Continue on Advair and albuterol -Continue on over-the-counter antihistamines

## 2022-07-30 NOTE — Assessment & Plan Note (Signed)
BMI 34.24 -Dietary modification and exercise as above

## 2022-07-30 NOTE — Assessment & Plan Note (Signed)
Flut shot administered by CMA no acute reaction reported.   

## 2022-07-30 NOTE — Assessment & Plan Note (Addendum)
Previous triglycerides were 156 -Dietary modification and exercise at least 3 times per week for 30 minutes recommended -We will recheck fasting blood work consider fenofibrate if triglycerides are higher

## 2022-07-30 NOTE — Assessment & Plan Note (Signed)
BMI 34.24 no comorbidities -  Dietary modification and exercise at least 3 times per week for 30 minutes advised.

## 2022-08-05 ENCOUNTER — Other Ambulatory Visit: Payer: Managed Care, Other (non HMO)

## 2022-08-05 DIAGNOSIS — E781 Pure hyperglyceridemia: Secondary | ICD-10-CM

## 2022-08-05 DIAGNOSIS — J4520 Mild intermittent asthma, uncomplicated: Secondary | ICD-10-CM

## 2022-08-05 LAB — COMPLETE METABOLIC PANEL WITH GFR
AG Ratio: 1.7 (calc) (ref 1.0–2.5)
ALT: 29 U/L (ref 6–29)
AST: 20 U/L (ref 10–35)
Albumin: 4.5 g/dL (ref 3.6–5.1)
Alkaline phosphatase (APISO): 66 U/L (ref 37–153)
BUN: 11 mg/dL (ref 7–25)
CO2: 33 mmol/L — ABNORMAL HIGH (ref 20–32)
Calcium: 9.9 mg/dL (ref 8.6–10.4)
Chloride: 103 mmol/L (ref 98–110)
Creat: 0.75 mg/dL (ref 0.50–1.03)
Globulin: 2.7 g/dL (calc) (ref 1.9–3.7)
Glucose, Bld: 111 mg/dL — ABNORMAL HIGH (ref 65–99)
Potassium: 3.8 mmol/L (ref 3.5–5.3)
Sodium: 143 mmol/L (ref 135–146)
Total Bilirubin: 0.3 mg/dL (ref 0.2–1.2)
Total Protein: 7.2 g/dL (ref 6.1–8.1)
eGFR: 93 mL/min/{1.73_m2} (ref >=60)

## 2022-08-05 LAB — CBC WITH DIFFERENTIAL/PLATELET
Absolute Monocytes: 688 cells/uL (ref 200–950)
Basophils Absolute: 81 cells/uL (ref 0–200)
Basophils Relative: 1.1 %
Eosinophils Absolute: 222 cells/uL (ref 15–500)
Eosinophils Relative: 3 %
HCT: 40 % (ref 35.0–45.0)
Hemoglobin: 13.9 g/dL (ref 11.7–15.5)
Lymphs Abs: 2479 cells/uL (ref 850–3900)
MCH: 32.4 pg (ref 27.0–33.0)
MCHC: 34.8 g/dL (ref 32.0–36.0)
MCV: 93.2 fL (ref 80.0–100.0)
MPV: 9.7 fL (ref 7.5–12.5)
Monocytes Relative: 9.3 %
Neutro Abs: 3929 cells/uL (ref 1500–7800)
Neutrophils Relative %: 53.1 %
Platelets: 258 10*3/uL (ref 140–400)
RBC: 4.29 10*6/uL (ref 3.80–5.10)
RDW: 12.7 % (ref 11.0–15.0)
Total Lymphocyte: 33.5 %
WBC: 7.4 10*3/uL (ref 3.8–10.8)

## 2022-08-05 LAB — LIPID PANEL
Cholesterol: 170 mg/dL (ref <200)
HDL: 51 mg/dL (ref >=50)
LDL Cholesterol (Calc): 88 mg/dL (calc)
Non-HDL Cholesterol (Calc): 119 mg/dL (calc) (ref <130)
Total CHOL/HDL Ratio: 3.3 (calc) (ref <5.0)
Triglycerides: 222 mg/dL — ABNORMAL HIGH (ref <150)

## 2023-01-27 ENCOUNTER — Ambulatory Visit (INDEPENDENT_AMBULATORY_CARE_PROVIDER_SITE_OTHER): Payer: Managed Care, Other (non HMO) | Admitting: Family

## 2023-01-27 ENCOUNTER — Encounter: Payer: Self-pay | Admitting: Family

## 2023-01-27 VITALS — BP 138/80 | HR 83 | Resp 16 | Ht 62.99 in | Wt 195.8 lb

## 2023-01-27 DIAGNOSIS — E6609 Other obesity due to excess calories: Secondary | ICD-10-CM

## 2023-01-27 DIAGNOSIS — Z1231 Encounter for screening mammogram for malignant neoplasm of breast: Secondary | ICD-10-CM | POA: Diagnosis not present

## 2023-01-27 DIAGNOSIS — J4520 Mild intermittent asthma, uncomplicated: Secondary | ICD-10-CM | POA: Diagnosis not present

## 2023-01-27 DIAGNOSIS — L409 Psoriasis, unspecified: Secondary | ICD-10-CM | POA: Diagnosis not present

## 2023-01-27 DIAGNOSIS — Z23 Encounter for immunization: Secondary | ICD-10-CM

## 2023-01-27 DIAGNOSIS — Z6834 Body mass index (BMI) 34.0-34.9, adult: Secondary | ICD-10-CM

## 2023-01-27 MED ORDER — FLUTICASONE-SALMETEROL 250-50 MCG/ACT IN AEPB: 1.0000 | INHALATION_SPRAY | Freq: Two times a day (BID) | RESPIRATORY_TRACT | 1 refills | Status: DC

## 2023-01-27 MED ORDER — TIRZEPATIDE 2.5 MG/0.5ML ~~LOC~~ SOAJ: 2.5000 mg | SUBCUTANEOUS | 0 refills | Status: DC

## 2023-01-27 MED ORDER — TRIAMCINOLONE ACETONIDE 0.1 % EX CREA: 1.0000 | TOPICAL_CREAM | Freq: Two times a day (BID) | CUTANEOUS | 3 refills | Status: AC

## 2023-01-27 NOTE — Progress Notes (Unsigned)
Provider: Marlowe Sax FNP-C  Obrien Huskins, Nelda Bucks, NP  Patient Care Team: Nicolis Boody, Nelda Bucks, NP as PCP - General (Family Medicine)  Extended Emergency Contact Information Primary Emergency Contact: [facility] Address: [place],  [place] Home Phone: [phone] Relation: None Secondary Emergency Contact: Fleek,Rachel Mobile Phone: [phone] Relation: Daughter  Code Status:Full Code  Goals of care: Advanced Directive information    03/12/2022    1:18 PM  Advanced Directives  Does Patient Have a Medical Advance Directive? No  Would patient like information on creating a medical advance directive? No - Patient declined     Chief Complaint  Patient presents with   Follow-up    Follow up   Quality Metric Gaps    Needs to discuss DTAP, Shingrix, pap smear-modifier, Covid vaccine and mammogram. [facility] verified    HPI:  Pt is a 57 y.o. female seen today for 6 months follow up for medical management.  Has medical history of hypertriglyceridemia, mild asthma, obesity among other conditions. She brought a letter from her insurance which states Advair is no longer covered under her plan would like to switch to fluticasone/ salmeterol which is covered under her plan.  She requests refills today stated almost running out of Advair. She denies any shortness of breath or wheezing.  States symptoms seems to be controlled with current medication. Has had 8 pounds weight gain since last visit.  No routine exercise reported. No recent hospital admission. Due for Pap smear.  Will schedule appointment for Pap smear.   Past Medical History:  Diagnosis Date   Allergy    Asthma    History of colonoscopy    History of mammogram    Past Surgical History:  Procedure Laterality Date   BREAST LUMPECTOMY WITH RADIOACTIVE SEED LOCALIZATION Right 12/11/2020   Procedure: RIGHT BREAST LUMPECTOMY WITH RADIOACTIVE SEED LOCALIZATION;  Surgeon: Coralie Keens,  MD;  Location: [facility];  Service: General;  Laterality: Right;  LMA   CESAREAN SECTION      Allergies  Allergen Reactions   Zoloft [Sertraline Hcl] Rash    Outpatient Encounter Medications as of 01/27/2023  Medication Sig   acetaminophen (MIDOL) 650 MG CR tablet Take 650 mg by mouth every 8 (eight) hours as needed for pain.   albuterol (VENTOLIN HFA) 108 (90 Base) MCG/ACT inhaler INHALE TWO PUFFS BY MOUTH EVERY 4 HOURS AS NEEDED FOR WHEEZING.   B Complex-C (SUPER B COMPLEX PO) Take 1 capsule by mouth daily.   Chlorpheniramine Maleate (ALLERGY PO) Take by mouth as needed.   Cholecalciferol (VITAMIN D3) 50 MCG (2000 UT) TABS Take 1 tablet by mouth daily.   fluticasone-salmeterol (ADVAIR) 250-50 MCG/ACT AEPB Inhale 1 puff into the lungs in the morning and at bedtime.   ibuprofen (ADVIL) 200 MG tablet Take 200 mg by mouth in the morning, at noon, in the evening, and at bedtime.   OVER THE COUNTER MEDICATION Take 1 capsule by mouth daily. Over 55 Multivitamin   OVER THE COUNTER MEDICATION Take by mouth as needed. Cold & Sinus   Zinc Sulfate (ZINC 15 PO) Take by mouth.   Acetaminophen (MIDOL PO) Take by mouth as needed. (Patient not taking: Reported on 01/27/2023)   [DISCONTINUED] clotrimazole-betamethasone (LOTRISONE) cream Apply 1 application. topically daily. (Patient not taking: Reported on 07/30/2022)   No facility-administered encounter medications on file as of 01/27/2023.    Review of Systems  Constitutional:  Negative for appetite  change, chills, fatigue, fever and unexpected weight change.  HENT:  Negative for congestion, dental problem, ear discharge, ear pain, facial swelling, hearing loss, nosebleeds, postnasal drip, rhinorrhea, sinus pressure, sinus pain, sneezing, sore throat, tinnitus and trouble swallowing.   Eyes:  Negative for pain, discharge, redness, itching and visual disturbance.  Respiratory:  Negative for cough, chest tightness, shortness of breath and  wheezing.   Cardiovascular:  Negative for chest pain, palpitations and leg swelling.  Gastrointestinal:  Negative for abdominal distention, abdominal pain, blood in stool, constipation, diarrhea, nausea and vomiting.  Endocrine: Negative for cold intolerance, heat intolerance, polydipsia, polyphagia and polyuria.  Genitourinary:  Negative for difficulty urinating, dysuria, flank pain, frequency and urgency.  Musculoskeletal:  Negative for arthralgias, back pain, gait problem, joint swelling, myalgias, neck pain and neck stiffness.  Skin:  Negative for color change, pallor, rash and wound.  Neurological:  Negative for dizziness, syncope, speech difficulty, weakness, light-headedness, numbness and headaches.  Hematological:  Does not bruise/bleed easily.  Psychiatric/Behavioral:  Negative for agitation, behavioral problems, confusion, hallucinations, self-injury, sleep disturbance and suicidal ideas. The patient is not nervous/anxious.     Immunization History  Administered Date(s) Administered   Influenza,inj,Quad PF,6+ Mos 07/30/2022   Influenza-Unspecified 08/25/2019   PFIZER(Purple Top)SARS-COV-2 Vaccination 01/27/2020, 02/17/2020, 12/01/2020, 11/23/2021   Pertinent  Health Maintenance Due  Topic Date Due   PAP SMEAR-Modifier  09/11/2018   MAMMOGRAM  09/04/2022   COLONOSCOPY (Pts 45-39yr Insurance coverage will need to be confirmed)  01/13/2027   INFLUENZA VACCINE  Completed      01/26/2018    4:54 PM 12/20/2019    2:43 PM 12/11/2020    7:34 AM 05/07/2021    1:31 PM 03/12/2022    1:18 PM  Fall Risk  Falls in the past year? No 0  0 0  Was there an injury with Fall?    0 0  Fall Risk Category Calculator    0 0  Fall Risk Category (Retired)    Low Low  (RETIRED) Patient Fall Risk Level   Low fall risk Low fall risk Low fall risk  Patient at Risk for Falls Due to    No Fall Risks No Fall Risks  Fall risk Follow up  Falls evaluation completed   Falls evaluation completed   Functional  Status Survey:    Vitals:   01/27/23 1014  BP: 138/80  Pulse: 83  Resp: 16  SpO2: 96%  Weight: 195 lb 12.8 oz (88.8 kg)  Height: 5' 2.99" (1.6 m)   Body mass index is 34.69 kg/m. Physical Exam Vitals reviewed.  Constitutional:      General: She is not in acute distress.    Appearance: Normal appearance. She is obese. She is not ill-appearing or diaphoretic.  HENT:     Head: Normocephalic.     Right Ear: Tympanic membrane, ear canal and external ear normal. There is no impacted cerumen.     Left Ear: Tympanic membrane, ear canal and external ear normal. There is no impacted cerumen.     Nose: Nose normal. No congestion or rhinorrhea.     Mouth/Throat:     Mouth: Mucous membranes are moist.     Pharynx: Oropharynx is clear. No oropharyngeal exudate or posterior oropharyngeal erythema.  Eyes:     General: No scleral icterus.       Right eye: No discharge.        Left eye: No discharge.     Extraocular Movements: Extraocular movements intact.  Conjunctiva/sclera: Conjunctivae normal.     Pupils: Pupils are equal, round, and reactive to light.  Neck:     Vascular: No carotid bruit.  Cardiovascular:     Rate and Rhythm: Normal rate and regular rhythm.     Pulses: Normal pulses.     Heart sounds: Normal heart sounds. No murmur heard.    No friction rub. No gallop.  Pulmonary:     Effort: Pulmonary effort is normal. No respiratory distress.     Breath sounds: Normal breath sounds. No wheezing, rhonchi or rales.  Chest:     Chest wall: No tenderness.  Abdominal:     General: Bowel sounds are normal. There is no distension.     Palpations: Abdomen is soft. There is no mass.     Tenderness: There is no abdominal tenderness. There is no right CVA tenderness, left CVA tenderness, guarding or rebound.  Musculoskeletal:        General: No swelling or tenderness. Normal range of motion.     Cervical back: Normal range of motion. No rigidity or tenderness.     Right lower leg:  No edema.     Left lower leg: No edema.  Lymphadenopathy:     Cervical: No cervical adenopathy.  Skin:    General: Skin is warm.     Coloration: Skin is not pale.     Findings: No bruising, erythema, lesion or rash.     Comments: Right elbow whitish dry itchy skin   Neurological:     Mental Status: She is alert and oriented to person, place, and time.     Cranial Nerves: No cranial nerve deficit.     Sensory: No sensory deficit.     Motor: No weakness.     Coordination: Coordination normal.     Gait: Gait normal.  Psychiatric:        Mood and Affect: Mood normal.        Speech: Speech normal.        Behavior: Behavior normal.        Thought Content: Thought content normal.        Judgment: Judgment normal.     Labs reviewed: Recent Labs    08/05/22 1024  NA 143  K 3.8  CL 103  CO2 33*  GLUCOSE 111*  BUN 11  CREATININE 0.75  CALCIUM 9.9   Recent Labs    08/05/22 1024  AST 20  ALT 29  BILITOT 0.3  PROT 7.2   Recent Labs    08/05/22 1024  WBC 7.4  NEUTROABS 3,929  HGB 13.9  HCT 40.0  MCV 93.2  PLT 258   Lab Results  Component Value Date   TSH 1.24 05/14/2021   Lab Results  Component Value Date   HGBA1C 5.4 09/12/2015   Lab Results  Component Value Date   CHOL 170 08/05/2022   HDL 51 08/05/2022   LDLCALC 88 08/05/2022   TRIG 222 (H) 08/05/2022   CHOLHDL 3.3 08/05/2022    Significant Diagnostic Results in last 30 days:  No results found.  Assessment/Plan 1. Mild intermittent asthma without complication Symptoms stable Advair no longer covered by her insurance plan will start on fluticasone -salmeterol  - fluticasone-salmeterol (ADVAIR) 250-50 MCG/ACT AEPB; Inhale 1 puff into the lungs in the morning and at bedtime.  Dispense: 180 each; Refill: 1  2. Class 1 obesity due to excess calories with body mass index (BMI) of 34.0 to 34.9 in adult, unspecified whether serious comorbidity  present BMI 34.69 with associated comorbidities  hypertriglyceridemia Dietary modification advised and exercise at least 3 times per week for 30 minutes. Start on [place] then follow-up in 1 month to adjust dosage if tolerated. - tirzepatide [facility]) 2.5 MG/0.5ML Pen; Inject 2.5 mg into the skin once a week.  Dispense: 2 mL; Refill: 0  3. Psoriasis Right elbow whitish dry skin.  Absorbs scratching during visit.  Advised to apply moisturizer and triamcinolone instead of scratching. - triamcinolone cream (KENALOG) 0.1 %; Apply 1 Application topically 2 (two) times daily.  Dispense: 30 g; Refill: 3  4. Breast cancer screening by mammogram As symptomatic - MM 3D SCREENING MAMMOGRAM BILATERAL BREAST  5. Need for Tdap vaccination Tdap administered by CMA no acute reaction reported.   - Tdap vaccine greater than or equal to 7yo IM  Family/ staff Communication: Reviewed plan of care with patient verbalized understanding  Labs/tests ordered: schedule for fasting labs next visit.   Next Appointment: Return in about 1 month (around 02/27/2023) for weight management .   Sandrea Hughs, NP

## 2023-02-24 ENCOUNTER — Ambulatory Visit: Payer: Managed Care, Other (non HMO) | Admitting: Family

## 2023-03-14 ENCOUNTER — Ambulatory Visit
Admission: RE | Admit: 2023-03-14 | Discharge: 2023-03-14 | Disposition: A | Discharge status: Home / Self Care | Payer: 59 | Source: Ambulatory Visit | Attending: Family | Admitting: Family

## 2023-07-15 ENCOUNTER — Encounter: Payer: Self-pay | Admitting: Family

## 2023-07-15 ENCOUNTER — Telehealth (INDEPENDENT_AMBULATORY_CARE_PROVIDER_SITE_OTHER): Payer: Managed Care, Other (non HMO) | Admitting: Family

## 2023-07-15 DIAGNOSIS — U071 COVID-19: Secondary | ICD-10-CM | POA: Diagnosis not present

## 2023-07-15 DIAGNOSIS — J069 Acute upper respiratory infection, unspecified: Secondary | ICD-10-CM | POA: Diagnosis not present

## 2023-07-15 MED ORDER — GUAIFENESIN ER 600 MG PO TB12: 600.0000 mg | ORAL_TABLET | Freq: Two times a day (BID) | ORAL | 0 refills | Status: AC

## 2023-07-15 MED ORDER — NIRMATRELVIR/RITONAVIR (PAXLOVID)TABLET: 3.0000 | ORAL_TABLET | Freq: Two times a day (BID) | ORAL | 0 refills | Status: AC

## 2023-07-15 NOTE — Progress Notes (Signed)
This service is provided via telemedicine  No vital signs collected/recorded due to the encounter was a telemedicine visit.   Location of patient (ex: home, work):  Home   Patient consents to a telephone visit:  Yes, 07/15/2023  Location of the provider (ex: office, home):  [facility]  Name of any referring provider:  Kinaya Hilliker, Donalee Citrin, NP   Names of all persons participating in the telemedicine service and their role in the encounter: Bethany B/CMA, Clover Feehan C, NP  and patient  Time spent on call:  11 minutes      Provider: Lemoyne Nestor FNP-C  Liliya Fullenwider, Donalee Citrin, NP  Patient Care Team: Soley Harriss, Donalee Citrin, NP as PCP - General (Family Medicine)  Extended Emergency Contact Information Primary Emergency Contact: [facility] Address: [address]          [place],  [address] Home Phone: [phone] Relation: None Secondary Emergency Contact: Scharf,Rachel Mobile Phone: [phone] Relation: Daughter  Code Status: Full Code  Goals of care: Advanced Directive information    07/15/2023    1:20 PM  Advanced Directives  Does Patient Have a Medical Advance Directive? No  Would patient like information on creating a medical advance directive? No - Patient declined     Chief Complaint  Patient presents with   Acute Visit    Tested positive yesterday     HPI:  Sherry Wheeler is a 57 y.o. female seen today for an acute visit for evaluation of COVID-19 positive yesterday. States food started tasting different.Sore throat,tactile fever,chills,body aches,productive cough x 3 days.No contact with sick persons but does work in [employer]. She denies any chest tightness,chest pain,palpitation or shortness of breath.Also denies any nausea or vomiting though stomach not really feeling well.  Asthma has been under control.    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   BREAST EXCISIONAL BIOPSY Right 12/11/2020   COMPLEX SCLEROSING LESION WITH USUAL DUCTAL HYPERPLASIA   BREAST LUMPECTOMY WITH RADIOACTIVE SEED LOCALIZATION Right 12/11/2020   Procedure: RIGHT BREAST LUMPECTOMY WITH RADIOACTIVE SEED LOCALIZATION;  Surgeon: Abigail Miyamoto, MD;  Location: [facility];  Service: General;  Laterality: Right;  LMA   CESAREAN SECTION      Allergies  Allergen Reactions   Zoloft [Sertraline Hcl] Rash    Outpatient Encounter Medications as of 07/15/2023  Medication Sig   acetaminophen (MIDOL) 650 MG CR tablet Take 650 mg by mouth every 8 (eight) hours as needed for pain.   albuterol (VENTOLIN HFA) 108 (90 Base) MCG/ACT inhaler INHALE TWO PUFFS BY MOUTH EVERY 4 HOURS AS NEEDED FOR WHEEZING.   B Complex-C (SUPER B COMPLEX PO) Take 1 capsule by mouth daily.   Chlorpheniramine Maleate (ALLERGY PO) Take by mouth as needed.   Cholecalciferol (VITAMIN D3) 50 MCG (2000 UT) TABS Take 1 tablet by mouth daily.   fluticasone-salmeterol (ADVAIR) 250-50 MCG/ACT AEPB Inhale 1 puff into the lungs in the morning and at bedtime.   ibuprofen (ADVIL) 200 MG tablet Take 200 mg by mouth in the morning, at noon, in the evening, and at bedtime.   OVER THE COUNTER MEDICATION Take 1 capsule by mouth daily. Over 55 Multivitamin   OVER THE COUNTER MEDICATION Take by mouth as needed.  Cold & Sinus   triamcinolone cream (KENALOG) 0.1 % Apply 1 Application topically 2 (two) times daily.   Zinc Sulfate (ZINC 15 PO) Take by mouth.   tirzepatide (MOUNJARO) 2.5 MG/0.5ML Pen Inject 2.5 mg into the skin once a week.   No facility-administered encounter medications on file as of 07/15/2023.    Review of Systems  Constitutional:  Positive for chills and fever. Negative for appetite change, fatigue and unexpected weight change.  HENT:  Positive for  rhinorrhea and sore throat. Negative for congestion, dental problem, ear discharge, ear pain, facial swelling, hearing loss, nosebleeds, postnasal drip, sinus pressure, sinus pain, sneezing, tinnitus and trouble swallowing.   Eyes:  Negative for pain, discharge, redness, itching and visual disturbance.  Respiratory:  Positive for cough. Negative for chest tightness, shortness of breath and wheezing.   Cardiovascular:  Negative for chest pain, palpitations and leg swelling.  Gastrointestinal:  Negative for abdominal distention, abdominal pain, diarrhea, nausea and vomiting.  Skin:  Negative for color change, pallor and rash.  Neurological:  Negative for dizziness, weakness, light-headedness and headaches.    Immunization History  Administered Date(s) Administered   Influenza,inj,Quad PF,6+ Mos 07/30/2022   Influenza-Unspecified 08/25/2019   PFIZER(Purple Top)SARS-COV-2 Vaccination 01/27/2020, 02/17/2020, 12/01/2020, 11/23/2021   Tdap 01/27/2023   Pertinent  Health Maintenance Due  Topic Date Due   PAP SMEAR-Modifier  09/11/2018   INFLUENZA VACCINE  06/19/2023   MAMMOGRAM  03/13/2025   Colonoscopy  01/13/2027      12/20/2019    2:43 PM 12/11/2020    7:34 AM 05/07/2021    1:31 PM 03/12/2022    1:18 PM 07/15/2023    1:20 PM  Fall Risk  Falls in the past year? 0  0 0 0  Was there an injury with Fall?   0 0 0  Fall Risk Category Calculator   0 0 0  Fall Risk Category (Retired)   Low Low   (RETIRED) Patient Fall Risk Level  Low fall risk Low fall risk Low fall risk   Patient at Risk for Falls Due to   No Fall Risks No Fall Risks No Fall Risks  Fall risk Follow up Falls evaluation completed   Falls evaluation completed Falls evaluation completed   Functional Status Survey:    There were no vitals filed for this visit. There is no height or weight on file to calculate BMI. Physical Exam Constitutional:      General: She is not in acute distress.    Appearance: She is not  ill-appearing.  Pulmonary:     Effort: Pulmonary effort is normal. No respiratory distress.  Neurological:     Mental Status: She is alert and oriented to person, place, and time.     Gait: Gait normal.  Psychiatric:        Mood and Affect: Mood normal.        Behavior: Behavior normal.     Labs reviewed: Recent Labs    08/05/22 1024  NA 143  K 3.8  CL 103  CO2 33*  GLUCOSE 111*  BUN 11  CREATININE 0.75  CALCIUM 9.9   Recent Labs    08/05/22 1024  AST 20  ALT 29  BILITOT 0.3  PROT 7.2   Recent Labs    08/05/22 1024  WBC 7.4  NEUTROABS 3,929  HGB 13.9  HCT 40.0  MCV 93.2  PLT 258   Lab Results  Component Value Date   TSH 1.24 05/14/2021   Lab Results  Component Value Date   HGBA1C 5.4 09/12/2015   Lab Results  Component Value Date   CHOL 170 08/05/2022   HDL 51 08/05/2022   LDLCALC 88 08/05/2022   TRIG 222 (H) 08/05/2022   CHOLHDL 3.3 08/05/2022    Significant Diagnostic Results in last 30 days:  No results found.  Assessment/Plan 1. COVID-19 virus infection Tested positive 07/14/2023  High risk due to asthma.discussed starting on Paxlovid. SE discussed.will need to hold Fluticasone due to interaction with Paxlovid then restart after 5 days. - nirmatrelvir/ritonavir (PAXLOVID) 20 x 150 MG & 10 x 100MG  TABS; Take 3 tablets by mouth 2 (two) times daily for 5 days. (Take nirmatrelvir 150 mg two tablets twice daily for 5 days and ritonavir 100 mg one tablet twice daily for 5 days) Patient GFR is 93  Dispense: 30 tablet; Refill: 0  2. Upper respiratory tract infection due to COVID-19 virus - supportive care with Vitamin D,Zinc and vitamin C supplement  - increase fluid intake/soup/teas - Mucinex for cough as below - Advised to notify provider if symptoms worsen or fail to improve  - guaiFENesin (MUCINEX) 600 MG 12 hr tablet; Take 1 tablet (600 mg total) by mouth 2 (two) times daily for 14 days.  Dispense: 28 tablet; Refill: 0 -  nirmatrelvir/ritonavir (PAXLOVID) 20 x 150 MG & 10 x 100MG  TABS; Take 3 tablets by mouth 2 (two) times daily for 5 days. (Take nirmatrelvir 150 mg two tablets twice daily for 5 days and ritonavir 100 mg one tablet twice daily for 5 days) Patient GFR is 93  Dispense: 30 tablet; Refill: 0  Family/ staff Communication: Reviewed plan of care with patient verbalized understanding   Labs/tests ordered: None   Next Appointment:.Return in about 22 days (around 08/06/2023) for annual Physical examination.  I connected with  SAANVIKA MCPHAUL on 07/15/23 by a video enabled telemedicine application and verified that I am speaking with the correct person using two identifiers.   I discussed the limitations of evaluation and management by telemedicine. The patient expressed understanding and agreed to proceed.  Spent 11 minutes of non-face to face with patient  >50% time spent counseling; reviewing medical record; labs; and developing future plan of care.   Caesar Bookman, NP

## 2023-07-15 NOTE — Patient Instructions (Addendum)
-   continue Zinc 50 mg tablet one by mouth daily for 14 days  - continue Vitamin C 500 mg tablet one by mouth twice daily x 14 days  - continue on vitamin D supplement  - Tylenol as needed for fever or body aches  - over the counter Mucinex as needed for cough -  increase fluid intake/soup/teas - Notify provider or go to ED if you develop any chest tightness,chest pain or shortness of breath

## 2023-07-15 NOTE — Progress Notes (Signed)
I connected with  Sherry Wheeler on 07/15/23 by a video enabled telemedicine application and verified that I am speaking with the correct person using two identifiers.   I discussed the limitations of evaluation and management by telemedicine. The patient expressed understanding and agreed to proceed.

## 2023-10-18 ENCOUNTER — Other Ambulatory Visit: Payer: Self-pay | Admitting: Family

## 2023-10-18 DIAGNOSIS — J4520 Mild intermittent asthma, uncomplicated: Secondary | ICD-10-CM

## 2023-10-20 ENCOUNTER — Ambulatory Visit (INDEPENDENT_AMBULATORY_CARE_PROVIDER_SITE_OTHER): Payer: Managed Care, Other (non HMO) | Admitting: Family

## 2023-10-20 ENCOUNTER — Encounter: Payer: Self-pay | Admitting: Family

## 2023-10-20 VITALS — BP 136/84 | HR 94 | Temp 98.6°F | Resp 20 | Ht 62.99 in | Wt 192.0 lb

## 2023-10-20 DIAGNOSIS — E559 Vitamin D deficiency, unspecified: Secondary | ICD-10-CM

## 2023-10-20 DIAGNOSIS — E6609 Other obesity due to excess calories: Secondary | ICD-10-CM

## 2023-10-20 DIAGNOSIS — Z5320 Procedure and treatment not carried out because of patient's decision for unspecified reasons: Secondary | ICD-10-CM

## 2023-10-20 DIAGNOSIS — R350 Frequency of micturition: Secondary | ICD-10-CM | POA: Diagnosis not present

## 2023-10-20 DIAGNOSIS — Z23 Encounter for immunization: Secondary | ICD-10-CM

## 2023-10-20 DIAGNOSIS — E66811 Obesity, class 1: Secondary | ICD-10-CM | POA: Diagnosis not present

## 2023-10-20 DIAGNOSIS — E781 Pure hyperglyceridemia: Secondary | ICD-10-CM

## 2023-10-20 DIAGNOSIS — J4520 Mild intermittent asthma, uncomplicated: Secondary | ICD-10-CM

## 2023-10-20 DIAGNOSIS — Z6834 Body mass index (BMI) 34.0-34.9, adult: Secondary | ICD-10-CM

## 2023-10-20 LAB — POCT URINALYSIS DIPSTICK
Bilirubin, UA: NEGATIVE
Glucose, UA: NEGATIVE
Ketones, UA: POSITIVE
Leukocytes, UA: NEGATIVE
Nitrite, UA: NEGATIVE
Protein, UA: POSITIVE — AB
Spec Grav, UA: 1.02 (ref 1.010–1.025)
Urobilinogen, UA: NEGATIVE U/dL — AB
pH, UA: 6 (ref 5.0–8.0)

## 2023-10-20 MED ORDER — MONTELUKAST SODIUM 10 MG PO TABS: 10.0000 mg | ORAL_TABLET | Freq: Every day | ORAL | 3 refills | Status: DC

## 2023-10-20 NOTE — Progress Notes (Signed)
Provider: Richarda Blade FNP-C   Riven Beebe, Donalee Citrin, NP  Patient Care Team: Cassundra Mckeever, Donalee Citrin, NP as PCP - General (Family Medicine)  Extended Emergency Contact Information Primary Emergency Contact: [facility] Address: [address]          [place],  [address] Home Phone: [phone] Relation: None Secondary Emergency Contact: Daus,Rachel Mobile Phone: [phone] Relation: Daughter  Code Status:  Full code  Goals of care: Advanced Directive information    07/15/2023    1:20 PM  Advanced Directives  Does Patient Have a Medical Advance Directive? No  Would patient like information on creating a medical advance directive? No - Patient declined     Chief Complaint  Patient presents with   Medical Management of Chronic Issues    HPI:  Pt is a 57 y.o. female seen today for  6 months follow up for medical management of chronic diseases.    Hypertriglyceridemia - previous 222 TC and LDL were normal last year.states no change in her diet.does walk for exercise.Has had 4 lbs weight loss since last year though states feels like has gained since she has been more short of breath.unclear if asthma related.   Asthma - Has been more short of breath though attribute to weight.No wheezing.uses Advair and Albuterol at least once daily.    Psoriasis - not bad.uses cream as needed.   Immunization - due for COVID-19,Zoster and Influenza vaccine.she will receive influenza vaccine today.Made aware to get COVID-19 and Shingles vaccine at the pharmacy.   Cervical cancer screen- due for pap smear but declines.    Past Medical History:  Diagnosis Date   Allergy    Asthma    History of colonoscopy    History of mammogram    Past Surgical History:  Procedure Laterality Date   BREAST BIOPSY Right 09/14/2020   COMPLEX SCLEROSING LESION WITH USUAL DUCTAL HYPERPLASIA   BREAST EXCISIONAL BIOPSY Right 12/11/2020   COMPLEX SCLEROSING LESION WITH USUAL DUCTAL HYPERPLASIA    BREAST LUMPECTOMY WITH RADIOACTIVE SEED LOCALIZATION Right 12/11/2020   Procedure: RIGHT BREAST LUMPECTOMY WITH RADIOACTIVE SEED LOCALIZATION;  Surgeon: Abigail Miyamoto, MD;  Location: [facility];  Service: General;  Laterality: Right;  LMA   CESAREAN SECTION      Allergies  Allergen Reactions   Zoloft [Sertraline Hcl] Rash    Allergies as of 10/20/2023       Reactions   Zoloft [sertraline Hcl] Rash        Medication List        Accurate as of October 20, 2023  9:25 AM. If you have any questions, ask your nurse or doctor.          albuterol 108 (90 Base) MCG/ACT inhaler Commonly known as: Ventolin HFA INHALE TWO PUFFS BY MOUTH EVERY 4 HOURS AS NEEDED FOR WHEEZING.   ALLERGY PO Take by mouth as needed.   fluticasone-salmeterol 250-50 MCG/ACT Aepb Commonly known as: ADVAIR Inhale 1 puff into the lungs in the morning and at bedtime.   ibuprofen 200 MG tablet Commonly known as: ADVIL Take 200 mg by mouth in the morning, at noon, in the evening, and at bedtime.   Midol 650 MG CR tablet Generic drug: acetaminophen Take 650 mg by mouth every 8 (eight) hours as needed for pain.   OVER THE COUNTER MEDICATION Take 1 capsule by mouth daily. Over 55 Multivitamin   OVER THE COUNTER MEDICATION Take by mouth as needed. Cold & Sinus   SUPER B COMPLEX  PO Take 1 capsule by mouth daily.   tirzepatide 2.5 MG/0.5ML Pen Commonly known as: MOUNJARO Inject 2.5 mg into the skin once a week.   triamcinolone cream 0.1 % Commonly known as: KENALOG Apply 1 Application topically 2 (two) times daily.   Vitamin D3 50 MCG (2000 UT) Tabs Take 1 tablet by mouth daily.   ZINC 15 PO Take by mouth.        Review of Systems  Constitutional:  Negative for appetite change, chills, fatigue, fever and unexpected weight change.  HENT:  Negative for congestion, dental problem, ear discharge, ear pain, facial swelling, hearing loss, nosebleeds, postnasal drip,  rhinorrhea, sinus pressure, sinus pain, sneezing, sore throat, tinnitus and trouble swallowing.   Eyes:  Negative for pain, discharge, redness, itching and visual disturbance.  Respiratory:  Negative for cough, chest tightness and wheezing.        Reports shortness of breath which she attributes with weight though weight loss noted.   Cardiovascular:  Negative for chest pain, palpitations and leg swelling.  Gastrointestinal:  Negative for abdominal distention, abdominal pain, blood in stool, constipation, diarrhea, nausea and vomiting.  Endocrine: Negative for cold intolerance, heat intolerance, polydipsia, polyphagia and polyuria.  Genitourinary:  Negative for difficulty urinating, dysuria, flank pain, frequency and urgency.  Musculoskeletal:  Negative for arthralgias, back pain, gait problem, joint swelling, myalgias, neck pain and neck stiffness.  Skin:  Negative for color change, pallor, rash and wound.  Neurological:  Negative for dizziness, syncope, speech difficulty, weakness, light-headedness, numbness and headaches.  Hematological:  Does not bruise/bleed easily.  Psychiatric/Behavioral:  Negative for agitation, behavioral problems, confusion, hallucinations, self-injury, sleep disturbance and suicidal ideas. The patient is not nervous/anxious.     Immunization History  Administered Date(s) Administered   Influenza,inj,Quad PF,6+ Mos 07/30/2022   Influenza-Unspecified 08/25/2019   PFIZER(Purple Top)SARS-COV-2 Vaccination 01/27/2020, 02/17/2020, 12/01/2020, 11/23/2021   Tdap 01/27/2023   Pertinent  Health Maintenance Due  Topic Date Due   INFLUENZA VACCINE  06/19/2023   MAMMOGRAM  03/13/2025   Colonoscopy  01/13/2027      12/20/2019    2:43 PM 12/11/2020    7:34 AM 05/07/2021    1:31 PM 03/12/2022    1:18 PM 07/15/2023    1:20 PM  Fall Risk  Falls in the past year? 0  0 0 0  Was there an injury with Fall?   0 0 0  Fall Risk Category Calculator   0 0 0  Fall Risk Category  (Retired)   Low Low   (RETIRED) Patient Fall Risk Level  Low fall risk Low fall risk Low fall risk   Patient at Risk for Falls Due to   No Fall Risks No Fall Risks No Fall Risks  Fall risk Follow up Falls evaluation completed   Falls evaluation completed Falls evaluation completed   Functional Status Survey:    Vitals:   10/20/23 0918  BP: 136/84  Pulse: 94  Resp: 20  Temp: 98.6 F (37 C)  SpO2: 98%  Weight: 192 lb (87.1 kg)  Height: 5' 2.99" (1.6 m)   Body mass index is 34.02 kg/m. Physical Exam Vitals reviewed.  Constitutional:      General: She is not in acute distress.    Appearance: Normal appearance. She is obese. She is not ill-appearing or diaphoretic.  HENT:     Head: Normocephalic.     Right Ear: Tympanic membrane, ear canal and external ear normal. There is no impacted cerumen.  Left Ear: Tympanic membrane, ear canal and external ear normal. There is no impacted cerumen.     Nose: Nose normal. No congestion or rhinorrhea.     Mouth/Throat:     Mouth: Mucous membranes are moist.     Pharynx: Oropharynx is clear. No oropharyngeal exudate or posterior oropharyngeal erythema.  Eyes:     General: No scleral icterus.       Right eye: No discharge.        Left eye: No discharge.     Extraocular Movements: Extraocular movements intact.     Conjunctiva/sclera: Conjunctivae normal.     Pupils: Pupils are equal, round, and reactive to light.  Neck:     Vascular: No carotid bruit.  Cardiovascular:     Rate and Rhythm: Normal rate and regular rhythm.     Pulses: Normal pulses.     Heart sounds: Normal heart sounds. No murmur heard.    No friction rub. No gallop.  Pulmonary:     Effort: Pulmonary effort is normal. No respiratory distress.     Breath sounds: Normal breath sounds. No wheezing, rhonchi or rales.  Chest:     Chest wall: No tenderness.  Abdominal:     General: Bowel sounds are normal. There is no distension.     Palpations: Abdomen is soft. There is  no mass.     Tenderness: There is no abdominal tenderness. There is no right CVA tenderness, left CVA tenderness, guarding or rebound.  Musculoskeletal:        General: No swelling or tenderness. Normal range of motion.     Cervical back: Normal range of motion. No rigidity or tenderness.     Right lower leg: No edema.     Left lower leg: No edema.  Lymphadenopathy:     Cervical: No cervical adenopathy.  Skin:    General: Skin is warm and dry.     Coloration: Skin is not pale.     Findings: No bruising, erythema, lesion or rash.  Neurological:     Mental Status: She is alert and oriented to person, place, and time.     Cranial Nerves: No cranial nerve deficit.     Sensory: No sensory deficit.     Motor: No weakness.     Coordination: Coordination normal.     Gait: Gait normal.  Psychiatric:        Mood and Affect: Mood normal.        Speech: Speech normal.        Behavior: Behavior normal.        Thought Content: Thought content normal.        Judgment: Judgment normal.     Labs reviewed: No results for input(s): "NA", "K", "CL", "CO2", "GLUCOSE", "BUN", "CREATININE", "CALCIUM", "MG", "PHOS" in the last 8760 hours. No results for input(s): "AST", "ALT", "ALKPHOS", "BILITOT", "PROT", "ALBUMIN" in the last 8760 hours. No results for input(s): "WBC", "NEUTROABS", "HGB", "HCT", "MCV", "PLT" in the last 8760 hours. Lab Results  Component Value Date   TSH 1.24 05/14/2021   Lab Results  Component Value Date   HGBA1C 5.4 09/12/2015   Lab Results  Component Value Date   CHOL 170 08/05/2022   HDL 51 08/05/2022   LDLCALC 88 08/05/2022   TRIG 222 (H) 08/05/2022   CHOLHDL 3.3 08/05/2022    Significant Diagnostic Results in last 30 days:  No results found.  Assessment/Plan  1. Mild intermittent asthma without complication Reports shortness of breath attributed to  weight.Bilateral lung clear to Auscultation.Discussed referral to pulmonology to check pulmonary Function Test  but declined.  - Advised to use albuterol every 6 hrs PRN instead of once daily.  - continue on Advair - CBC with Differential/Platelet - Complete Metabolic Panel with eGFR - montelukast (SINGULAIR) 10 MG tablet; Take 1 tablet (10 mg total) by mouth at bedtime.  Dispense: 30 tablet; Refill: 3  2. Hypertriglyceridemia Previous TRG 222 - Recommend a low saturated fats, low carbohydrates and high vegetable diet also exercise at least 3 times per week for 30 minutes. - Lipid Panel  3. Class 1 obesity due to excess calories with body mass index (BMI) of 34.0 to 34.9 in adult, unspecified whether serious comorbidity present BMI 34.02  - dietary modification and exercise advised    4. Need for influenza vaccination Afebrile  - Flu shot administered by CMA no acute reaction reported.  - Flu vaccine trivalent PF, 6mos and older(Flulaval,Afluria,Fluarix,Fluzone)  5. Vitamin D deficiency Continue on vitamin D supplement  - Vitamin D, 1,25-dihydroxy  6. Frequency of urination Afebrile  - suprapubic tender to palpation - encouraged to increase water intake   - Urine Culture  Family/ staff Communication: Reviewed plan of care with patient verbalized understanding   Labs/tests ordered:  .- CBC with Differential/Platelet - Complete Metabolic Panel with eGFR - Lipid Panel - Vitamin D, 1,25-dihydroxy  Next Appointment : Return in about 6 months (around 04/19/2024) for medical mangement of chronic issues.Caesar Bookman, NP

## 2023-10-21 LAB — URINE CULTURE
MICRO NUMBER:: 15797844
Result:: NO GROWTH
SPECIMEN QUALITY:: ADEQUATE

## 2023-10-24 ENCOUNTER — Other Ambulatory Visit: Payer: Self-pay | Admitting: Family

## 2023-10-24 DIAGNOSIS — R748 Abnormal levels of other serum enzymes: Secondary | ICD-10-CM

## 2023-10-24 LAB — VITAMIN D 1,25 DIHYDROXY
Vitamin D 1, 25 (OH)2 Total: 39 pg/mL (ref 18–72)
Vitamin D2 1, 25 (OH)2: <8 pg/mL
Vitamin D3 1, 25 (OH)2: 39 pg/mL

## 2023-10-24 LAB — CBC WITH DIFFERENTIAL/PLATELET
Absolute Lymphocytes: 1994 {cells}/uL (ref 850–3900)
Absolute Monocytes: 626 {cells}/uL (ref 200–950)
Basophils Absolute: 86 {cells}/uL (ref 0–200)
Basophils Relative: 1.2 %
Eosinophils Absolute: 367 {cells}/uL (ref 15–500)
Eosinophils Relative: 5.1 %
HCT: 43 % (ref 35.0–45.0)
Hemoglobin: 14.3 g/dL (ref 11.7–15.5)
MCH: 31 pg (ref 27.0–33.0)
MCHC: 33.3 g/dL (ref 32.0–36.0)
MCV: 93.1 fL (ref 80.0–100.0)
MPV: 9.6 fL (ref 7.5–12.5)
Monocytes Relative: 8.7 %
Neutro Abs: 4126 {cells}/uL (ref 1500–7800)
Neutrophils Relative %: 57.3 %
Platelets: 284 10*3/uL (ref 140–400)
RBC: 4.62 10*6/uL (ref 3.80–5.10)
RDW: 12.7 % (ref 11.0–15.0)
Total Lymphocyte: 27.7 %
WBC: 7.2 10*3/uL (ref 3.8–10.8)

## 2023-10-24 LAB — LIPID PANEL
Cholesterol: 174 mg/dL (ref <200)
HDL: 56 mg/dL (ref >=50)
LDL Cholesterol (Calc): 90 mg/dL
Non-HDL Cholesterol (Calc): 118 mg/dL (ref <130)
Total CHOL/HDL Ratio: 3.1 (calc) (ref <5.0)
Triglycerides: 182 mg/dL — ABNORMAL HIGH (ref <150)

## 2023-10-24 LAB — COMPLETE METABOLIC PANEL WITH GFR
AG Ratio: 1.9 (calc) (ref 1.0–2.5)
ALT: 30 U/L — ABNORMAL HIGH (ref 6–29)
AST: 20 U/L (ref 10–35)
Albumin: 4.6 g/dL (ref 3.6–5.1)
Alkaline phosphatase (APISO): 64 U/L (ref 37–153)
BUN: 14 mg/dL (ref 7–25)
CO2: 30 mmol/L (ref 20–32)
Calcium: 9.7 mg/dL (ref 8.6–10.4)
Chloride: 104 mmol/L (ref 98–110)
Creat: 0.76 mg/dL (ref 0.50–1.03)
Globulin: 2.4 g/dL (ref 1.9–3.7)
Glucose, Bld: 102 mg/dL — ABNORMAL HIGH (ref 65–99)
Potassium: 4 mmol/L (ref 3.5–5.3)
Sodium: 141 mmol/L (ref 135–146)
Total Bilirubin: 0.3 mg/dL (ref 0.2–1.2)
Total Protein: 7 g/dL (ref 6.1–8.1)
eGFR: 91 mL/min/{1.73_m2} (ref >=60)

## 2023-11-05 ENCOUNTER — Other Ambulatory Visit: Payer: Self-pay

## 2023-11-05 DIAGNOSIS — R748 Abnormal levels of other serum enzymes: Secondary | ICD-10-CM

## 2023-11-28 ENCOUNTER — Other Ambulatory Visit: Payer: Managed Care, Other (non HMO)

## 2023-11-29 LAB — HEPATIC FUNCTION PANEL
AG Ratio: 1.8 (calc) (ref 1.0–2.5)
ALT: 34 U/L — ABNORMAL HIGH (ref 6–29)
AST: 25 U/L (ref 10–35)
Albumin: 4.8 g/dL (ref 3.6–5.1)
Alkaline phosphatase (APISO): 68 U/L (ref 37–153)
Bilirubin, Direct: 0.1 mg/dL (ref 0.0–0.2)
Globulin: 2.6 g/dL (ref 1.9–3.7)
Indirect Bilirubin: 0.4 mg/dL (ref 0.2–1.2)
Total Bilirubin: 0.5 mg/dL (ref 0.2–1.2)
Total Protein: 7.4 g/dL (ref 6.1–8.1)

## 2023-12-03 ENCOUNTER — Other Ambulatory Visit: Payer: Self-pay | Admitting: Family

## 2023-12-03 DIAGNOSIS — R748 Abnormal levels of other serum enzymes: Secondary | ICD-10-CM

## 2023-12-10 ENCOUNTER — Ambulatory Visit
Admission: RE | Admit: 2023-12-10 | Discharge: 2023-12-10 | Disposition: A | Discharge status: Home / Self Care | Payer: 59 | Source: Ambulatory Visit | Attending: Family | Admitting: Family

## 2023-12-10 ENCOUNTER — Other Ambulatory Visit: Payer: Self-pay | Admitting: Family

## 2023-12-10 DIAGNOSIS — K8050 Calculus of bile duct without cholangitis or cholecystitis without obstruction: Secondary | ICD-10-CM | POA: Insufficient documentation

## 2023-12-10 DIAGNOSIS — R748 Abnormal levels of other serum enzymes: Secondary | ICD-10-CM

## 2023-12-10 DIAGNOSIS — K760 Fatty (change of) liver, not elsewhere classified: Secondary | ICD-10-CM | POA: Insufficient documentation

## 2023-12-15 ENCOUNTER — Other Ambulatory Visit (INDEPENDENT_AMBULATORY_CARE_PROVIDER_SITE_OTHER): Payer: 59

## 2023-12-15 ENCOUNTER — Ambulatory Visit (INDEPENDENT_AMBULATORY_CARE_PROVIDER_SITE_OTHER): Payer: 59 | Admitting: Gastroenterology

## 2023-12-15 ENCOUNTER — Encounter: Payer: Self-pay | Admitting: Gastroenterology

## 2023-12-15 VITALS — BP 130/80 | HR 90 | Ht 62.0 in | Wt 188.0 lb

## 2023-12-15 DIAGNOSIS — K760 Fatty (change of) liver, not elsewhere classified: Secondary | ICD-10-CM

## 2023-12-15 DIAGNOSIS — R140 Abdominal distension (gaseous): Secondary | ICD-10-CM

## 2023-12-15 DIAGNOSIS — R1030 Lower abdominal pain, unspecified: Secondary | ICD-10-CM | POA: Insufficient documentation

## 2023-12-15 DIAGNOSIS — Z1211 Encounter for screening for malignant neoplasm of colon: Secondary | ICD-10-CM

## 2023-12-15 LAB — TSH: TSH: 1.79 u[IU]/mL (ref 0.35–5.50)

## 2023-12-15 MED ORDER — NA SULFATE-K SULFATE-MG SULF 17.5-3.13-1.6 GM/177ML PO SOLN: 1.0000 | Freq: Once | ORAL | 0 refills | Status: AC

## 2023-12-15 NOTE — Progress Notes (Signed)
12/15/2023 AMERE IOTT [id number] 28-Oct-1966   HISTORY OF PRESENT ILLNESS: This is a 58 year old female who is new to our office.  She was actually referred here after an ultrasound showed fatty liver and gallstones.  She was complaining of some bloating and had a slight bump in her ALT at 30 so ultrasound was performed and showed fatty liver and gallstones.  She complains though of extreme fatigue.  She says that she has a lot of bloating and pressure in her lower abdomen.  The pressure in her lower abdomen that was only been present for about the past week or so.  She says that she is not sure if feels like period pains or if she needs to have a bowel movement.  She says this is kind of a low-key/constant discomfort all the time.  Says that she has a bowel movement about every other day or so.  No rectal bleeding.  Me that she had a colonoscopy probably at least 10 years ago in [facility].  She does not drink alcohol.  Does use some ibuprofen and Midol quite regularly.   Past Medical History:  Diagnosis Date   Allergy    Asthma    History of colonoscopy    History of mammogram    Past Surgical History:  Procedure Laterality Date   BREAST BIOPSY Right 09/14/2020   COMPLEX SCLEROSING LESION WITH USUAL DUCTAL HYPERPLASIA   BREAST EXCISIONAL BIOPSY Right 12/11/2020   COMPLEX SCLEROSING LESION WITH USUAL DUCTAL HYPERPLASIA   BREAST LUMPECTOMY WITH RADIOACTIVE SEED LOCALIZATION Right 12/11/2020   Procedure: RIGHT BREAST LUMPECTOMY WITH RADIOACTIVE SEED LOCALIZATION;  Surgeon: Abigail Miyamoto, MD;  Location:  [facility];  Service: General;  Laterality: Right;  LMA   CESAREAN SECTION      reports that she has quit smoking. Her smoking use included cigarettes. She has been exposed to tobacco smoke. She has never used smokeless tobacco. She reports that she does not drink alcohol and does not use drugs. family history includes COPD in her father; Cancer in her father  and paternal grandfather; Diabetes in her paternal grandmother; Heart disease in her paternal grandmother; Hypertension in her father. Allergies  Allergen Reactions   Zoloft [Sertraline Hcl] Rash      Outpatient Encounter Medications as of 12/15/2023  Medication Sig   albuterol (VENTOLIN HFA) 108 (90 Base) MCG/ACT inhaler INHALE TWO PUFFS BY MOUTH EVERY 4 HOURS AS NEEDED FOR WHEEZING.   B Complex-C (SUPER B COMPLEX PO) Take 1 capsule by mouth daily.   Chlorpheniramine Maleate (ALLERGY PO) Take by mouth as needed.   Cholecalciferol (VITAMIN D3) 50 MCG (2000 UT) TABS Take 1 tablet by mouth daily.   fluticasone-salmeterol (ADVAIR) 250-50 MCG/ACT AEPB INHALE 1 DOSE BY MOUTH IN THE MORNING AND AT BEDTIME   ibuprofen (ADVIL) 200 MG tablet Take 200 mg by mouth in the morning, at noon, in the evening, and at bedtime.   montelukast (SINGULAIR) 10 MG tablet Take 1 tablet (10 mg total) by mouth at bedtime.   OVER THE COUNTER MEDICATION Take 1 capsule by mouth daily. Over 55 Multivitamin   OVER THE COUNTER MEDICATION Take by mouth as needed. Cold & Sinus   triamcinolone cream (KENALOG) 0.1 % Apply 1 Application topically 2 (two) times daily.   Zinc Sulfate (ZINC 15 PO) Take by mouth.   acetaminophen (MIDOL) 650 MG CR tablet Take 650 mg by mouth every 8 (eight) hours as needed for pain. (Patient not taking: Reported  on 12/15/2023)   No facility-administered encounter medications on file as of 12/15/2023.    REVIEW OF SYSTEMS  : All other systems reviewed and negative except where noted in the History of Present Illness.   PHYSICAL EXAM: BP 130/80   Pulse 90   Ht 5\' 2"  (1.575 m)   Wt 188 lb (85.3 kg)   BMI 34.39 kg/m  General: Well developed white female in no acute distress Head: Normocephalic and atraumatic Eyes:  Sclerae anicteric, conjunctiva pink. Ears: Normal auditory acuity Lungs: Clear throughout to auscultation; no W/R/R. Heart: Regular rate and rhythm; no M/R/G. Abdomen: Soft,  non-distended.  BS present.  Mild lower abdominal tenderness to palpation. Rectal:  Will be done at the time of colonoscopy. Musculoskeletal: Symmetrical with no gross deformities  Skin: No lesions on visible extremities Extremities: No edema  Neurological: Alert oriented x 4, grossly non-focal Psychological:  Alert and cooperative. Normal mood and affect  ASSESSMENT AND PLAN: *Lower abdominal pain and bloating: Question GI source versus GYN source.  Will check a TSH and celiac labs.  Will plan for colonoscopy since she has not had one in a long time.  Overall it seems like she is not extremely constipated.  Has a bowel movement every other day at least.  Pending results of colonoscopy if pain continues could consider CT scan versus starting with a pelvic ultrasound/GYN evaluation. *Colorectal cancer screening: Last colonoscopy 10 years ago or more in [facility]. *Fatty liver: Fib 4 score is low at 0.7.  Discussed good diet, exercise, weight loss, etc.  LFTs essentially normal except slightly elevated ALT at 34.  **Colonoscopy scheduled with Dr. Lavon Paganini.  The risks, benefits, and alternatives to colonoscopy were discussed with the patient and she consents to proceed.   CC:  Ngetich, Donalee Citrin, NP

## 2023-12-15 NOTE — Patient Instructions (Signed)
Your provider has requested that you go to the basement level for lab work before leaving today. Press "B" on the elevator. The lab is located at the first door on the left as you exit the elevator.  You have been scheduled for a colonoscopy. Please follow written instructions given to you at your visit today.   Please pick up your prep supplies at the pharmacy within the next 1-3 days.  If you use inhalers (even only as needed), please bring them with you on the day of your procedure.  DO NOT TAKE 7 DAYS PRIOR TO TEST- Trulicity (dulaglutide) Ozempic, Wegovy (semaglutide) Mounjaro (tirzepatide) Bydureon Bcise (exanatide extended release)  DO NOT TAKE 1 DAY PRIOR TO YOUR TEST Rybelsus (semaglutide) Adlyxin (lixisenatide) Victoza (liraglutide) Byetta (exanatide) ___________________________________________________________________________   If your blood pressure at your visit was 140/90 or greater, please contact your primary care physician to follow up on this.  _______________________________________________________  If you are age 59 or older, your body mass index should be between 23-30. Your Body mass index is 34.39 kg/m. If this is out of the aforementioned range listed, please consider follow up with your Primary Care Provider.  If you are age 57 or younger, your body mass index should be between 19-25. Your Body mass index is 34.39 kg/m. If this is out of the aformentioned range listed, please consider follow up with your Primary Care Provider.   ________________________________________________________  The [facility] providers would like to encourage you to use [facility] to communicate with providers for non-urgent requests or questions.  Due to long hold times on the telephone, sending your provider a message by [facility] may be a faster and more efficient way to get a response.  Please allow 48 business hours for a response.  Please remember that this is for non-urgent  requests.  ______________________________________________________

## 2023-12-16 LAB — TISSUE TRANSGLUTAMINASE, IGA: (tTG) Ab, IgA: <1 U/mL

## 2023-12-16 LAB — IGA: Immunoglobulin A: 232 mg/dL (ref 47–310)

## 2024-01-09 ENCOUNTER — Encounter: Payer: Self-pay | Admitting: Gastroenterology

## 2024-01-09 ENCOUNTER — Ambulatory Visit: Payer: 59 | Admitting: Gastroenterology

## 2024-01-09 VITALS — BP 121/76 | HR 73 | Temp 98.2°F | Resp 14 | Ht 62.0 in | Wt 188.0 lb

## 2024-01-09 DIAGNOSIS — K5730 Diverticulosis of large intestine without perforation or abscess without bleeding: Secondary | ICD-10-CM | POA: Diagnosis not present

## 2024-01-09 DIAGNOSIS — K644 Residual hemorrhoidal skin tags: Secondary | ICD-10-CM

## 2024-01-09 DIAGNOSIS — K648 Other hemorrhoids: Secondary | ICD-10-CM | POA: Diagnosis not present

## 2024-01-09 DIAGNOSIS — Z1211 Encounter for screening for malignant neoplasm of colon: Secondary | ICD-10-CM

## 2024-01-09 DIAGNOSIS — D123 Benign neoplasm of transverse colon: Secondary | ICD-10-CM | POA: Diagnosis not present

## 2024-01-09 MED ORDER — SODIUM CHLORIDE 0.9 % IV SOLN: 500.0000 mL | INTRAVENOUS | Status: DC

## 2024-01-09 NOTE — Op Note (Signed)
[facility] Patient Name: Sherry Wheeler Procedure Date: 01/09/2024 8:15 AM MRN: [id number] Endoscopist: Napoleon Form , MD, [id number] Age: 58 Referring MD:  Date of Birth: 1966/04/12 Gender: Female Account #: [id number] Procedure:                Colonoscopy Indications:              Screening for colorectal malignant neoplasm Medicines:                Monitored Anesthesia Care Procedure:                Pre-Anesthesia Assessment:                           - Prior to the procedure, a History and Physical                            was performed, and patient medications and                            allergies were reviewed. The patient's tolerance of                            previous anesthesia was also reviewed. The risks                            and benefits of the procedure and the sedation                            options and risks were discussed with the patient.                            All questions were answered, and informed consent                            was obtained. Prior Anticoagulants: The patient has                            taken no anticoagulant or antiplatelet agents. ASA                            Grade Assessment: II - A patient with mild systemic                            disease. After reviewing the risks and benefits,                            the patient was deemed in satisfactory condition to                            undergo the procedure.                           After obtaining informed consent, the colonoscope  was passed under direct vision. Throughout the                            procedure, the patient's blood pressure, pulse, and                            oxygen saturations were monitored continuously. The                            [id number]-HQ190L Colonoscope [id number] was introduced                            through the anus and advanced to the the terminal                            ileum,  with identification of the appendiceal                            orifice and IC valve. The colonoscopy was performed                            without difficulty. The patient tolerated the                            procedure well. The quality of the bowel                            preparation was good. The ileocecal valve,                            appendiceal orifice, and rectum were photographed. Scope In: 8:31:41 AM Scope Out: 8:45:35 AM Scope Withdrawal Time: 0 hours 11 minutes 44 seconds  Total Procedure Duration: 0 hours 13 minutes 54 seconds  Findings:                 The perianal and digital rectal examinations were                            normal.                           A 7 mm polyp was found in the transverse colon. The                            polyp was sessile. The polyp was removed with a                            cold snare. Resection and retrieval were complete.                           Scattered large-mouthed, medium-mouthed and                            small-mouthed diverticula were found in the sigmoid  colon, descending colon, transverse colon and                            ascending colon. There was evidence of an impacted                            diverticulum.                           Non-bleeding external and internal hemorrhoids were                            found during retroflexion. The hemorrhoids were                            medium-sized. Complications:            No immediate complications. Estimated Blood Loss:     Estimated blood loss was minimal. Impression:               - One 7 mm polyp in the transverse colon, removed                            with a cold snare. Resected and retrieved.                           - Moderate diverticulosis in the sigmoid colon, in                            the descending colon, in the transverse colon and                            in the ascending colon. There was  evidence of an                            impacted diverticulum.                           - Non-bleeding external and internal hemorrhoids. Recommendation:           - Patient has a contact number available for                            emergencies. The signs and symptoms of potential                            delayed complications were discussed with the                            patient. Return to normal activities tomorrow.                            Written discharge instructions were provided to the                            patient.                           -  Resume previous diet.                           - Continue present medications.                           - Await pathology results.                           - Repeat colonoscopy in 5-10 years for surveillance                            based on pathology results. Napoleon Form, MD 01/09/2024 8:54:46 AM This report has been signed electronically.

## 2024-01-09 NOTE — Progress Notes (Signed)
 A/O x 3, gd SR's, VSS, report to RN

## 2024-01-09 NOTE — Progress Notes (Signed)
[facility] History and Physical   Primary Care Physician:  Ngetich, Donalee Citrin, NP   Reason for Procedure:  Colorectal cancer screening  Plan:    Screening colonoscopy with possible interventions as needed     HPI: Sherry Wheeler is a very pleasant 58 y.o. female here for screening colonoscopy. Denies any nausea, vomiting, abdominal pain, melena or bright red blood per rectum  The risks and benefits as well as alternatives of endoscopic procedure(s) have been discussed and reviewed. All questions answered. The patient agrees to proceed.    Past Medical History:  Diagnosis Date   Allergy    Asthma    History of colonoscopy    History of mammogram     Past Surgical History:  Procedure Laterality Date   BREAST BIOPSY Right 09/14/2020   COMPLEX SCLEROSING LESION WITH USUAL DUCTAL HYPERPLASIA   BREAST EXCISIONAL BIOPSY Right 12/11/2020   COMPLEX SCLEROSING LESION WITH USUAL DUCTAL HYPERPLASIA   BREAST LUMPECTOMY WITH RADIOACTIVE SEED LOCALIZATION Right 12/11/2020   Procedure: RIGHT BREAST LUMPECTOMY WITH RADIOACTIVE SEED LOCALIZATION;  Surgeon: Abigail Miyamoto, MD;  Location: [facility];  Service: General;  Laterality: Right;  LMA   CESAREAN SECTION      Prior to Admission medications   Medication Sig Start Date End Date Taking? Authorizing Provider  acetaminophen (MIDOL) 650 MG CR tablet Take 650 mg by mouth every 8 (eight) hours as needed for pain.   Yes [provider]  B Complex-C (SUPER B COMPLEX PO) Take 1 capsule by mouth daily.   Yes [provider]  Chlorpheniramine Maleate (ALLERGY PO) Take by mouth as needed.   Yes [provider]  Cholecalciferol (VITAMIN D3) 50 MCG (2000 UT) TABS Take 1 tablet by mouth daily.   Yes [provider]  fluticasone-salmeterol (ADVAIR) 250-50 MCG/ACT AEPB INHALE 1 DOSE BY MOUTH IN THE MORNING AND AT BEDTIME 10/20/23  Yes Ngetich, Dinah C, NP  ibuprofen (ADVIL) 200 MG  tablet Take 200 mg by mouth in the morning, at noon, in the evening, and at bedtime.   Yes [provider]  montelukast (SINGULAIR) 10 MG tablet Take 1 tablet (10 mg total) by mouth at bedtime. 10/20/23  Yes Ngetich, Dinah C, NP  OVER THE COUNTER MEDICATION Take 1 capsule by mouth daily. Over 55 Multivitamin   Yes [provider]  triamcinolone cream (KENALOG) 0.1 % Apply 1 Application topically 2 (two) times daily. 01/27/23  Yes Ngetich, Dinah C, NP  Zinc Sulfate (ZINC 15 PO) Take by mouth.   Yes [provider]  albuterol (VENTOLIN HFA) 108 (90 Base) MCG/ACT inhaler INHALE TWO PUFFS BY MOUTH EVERY 4 HOURS AS NEEDED FOR WHEEZING. 07/30/22   Ngetich, Donalee Citrin, NP  OVER THE COUNTER MEDICATION Take by mouth as needed. Cold & Sinus    [provider]    Current Outpatient Medications  Medication Sig Dispense Refill   acetaminophen (MIDOL) 650 MG CR tablet Take 650 mg by mouth every 8 (eight) hours as needed for pain.     B Complex-C (SUPER B COMPLEX PO) Take 1 capsule by mouth daily.     Chlorpheniramine Maleate (ALLERGY PO) Take by mouth as needed.     Cholecalciferol (VITAMIN D3) 50 MCG (2000 UT) TABS Take 1 tablet by mouth daily.     fluticasone-salmeterol (ADVAIR) 250-50 MCG/ACT AEPB INHALE 1 DOSE BY MOUTH IN THE MORNING AND AT BEDTIME 180 each 0   ibuprofen (ADVIL) 200 MG tablet Take 200 mg by  mouth in the morning, at noon, in the evening, and at bedtime.     montelukast (SINGULAIR) 10 MG tablet Take 1 tablet (10 mg total) by mouth at bedtime. 30 tablet 3   OVER THE COUNTER MEDICATION Take 1 capsule by mouth daily. Over 55 Multivitamin     triamcinolone cream (KENALOG) 0.1 % Apply 1 Application topically 2 (two) times daily. 30 g 3   Zinc Sulfate (ZINC 15 PO) Take by mouth.     albuterol (VENTOLIN HFA) 108 (90 Base) MCG/ACT inhaler INHALE TWO PUFFS BY MOUTH EVERY 4 HOURS AS NEEDED FOR WHEEZING. 1 each 2   OVER THE COUNTER MEDICATION Take by mouth as needed.  Cold & Sinus     Current Facility-Administered Medications  Medication Dose Route Frequency Provider Last Rate Last Admin   0.9 %  sodium chloride infusion  500 mL Intravenous Continuous Loriene Taunton, Eleonore Chiquito, MD        Allergies as of 01/09/2024 - Review Complete 01/09/2024  Allergen Reaction Noted   Zoloft [sertraline hcl] Rash 05/17/2012    Family History  Problem Relation Age of Onset   Cancer Father    COPD Father    Hypertension Father    Diabetes Paternal Grandmother    Heart disease Paternal Grandmother    Cancer Paternal Grandfather    Breast cancer Neg Hx     Social History   Socioeconomic History   Marital status: Married    Spouse name: Rich   Number of children: 1   Years of education: 14+   Highest education level: Associate degree: academic program  Occupational History   Occupation: Child psychotherapist    Comment: [employer]   Occupation: Production assistant, radio  Tobacco Use   Smoking status: Former    Current packs/day: 1.00    Types: Cigarettes, E-cigarettes    Passive exposure: Past   Smokeless tobacco: Never  Vaping Use   Vaping status: Every Day   Substances: Nicotine  Substance and Sexual Activity   Alcohol use: Never    Comment: social-rare   Drug use: No   Sexual activity: Not Currently  Other Topics Concern   Not on file  Social History Narrative   Tobacco use, amount per day now: Vape   Past tobacco use, amount per day: Cigarettes- 1 pack a day ( quit 8 years ago)   How many years did you use tobacco: Off and On about 20 years.   Alcohol use (drinks per week):   Diet:   Do you drink/eat things with caffeine: Yes   Marital status:  Married                                What year were you married? 1992   Do you live in a house, apartment, assisted living, condo, trailer, etc.? Condo   Is it one or more stories? One   How many persons live in your home? 2   Do you have pets in your home?( please list) 1 Cat   Highest Level of education completed? Some College    Current or past profession: Server   Do you exercise?     At Work                             Type and how often? Walking Constantly.   Do you have a living will? No.   Do you  have a DNR form? No.                                  If not, do you want to discuss one?   Do you have signed POA/HPOA forms? No.                     If so, please bring to you appointment      Do you have any difficulty bathing or dressing yourself? No.   Do you have any difficulty preparing food or eating? No.   Do you have any difficulty managing your medications? No.   Do you have any difficulty managing your finances? No.   Do you have any difficulty affording your medications? No.   Social Drivers of Corporate investment banker Strain: Not on file  Food Insecurity: No Food Insecurity (10/19/2023)   Hunger Vital Sign    Worried About Running Out of Food in the Last Year: Never true    Ran Out of Food in the Last Year: Never true  Transportation Needs: No Transportation Needs (10/19/2023)   PRAPARE - Administrator, Civil Service (Medical): No    Lack of Transportation (Non-Medical): No  Physical Activity: Sufficiently Active (10/19/2023)   Exercise Vital Sign    Days of Exercise per Week: 3 days    Minutes of Exercise per Session: 90 min  Stress: No Stress Concern Present (10/19/2023)   [employer] of Occupational Health - Occupational Stress Questionnaire    Feeling of Stress : Only a little  Social Connections: Unknown (10/19/2023)   Social Connection and Isolation Panel [NHANES]    Frequency of Communication with Friends and Family: More than three times a week    Frequency of Social Gatherings with Friends and Family: Once a week    Attends Religious Services: 1 to 4 times per year    Active Member of [employer] or Organizations: Not on file    Attends Banker Meetings: Not on file    Marital Status: Married  Catering manager Violence: Not on file    Review of  Systems:  All other review of systems negative except as mentioned in the HPI.  Physical Exam: Vital signs in last 24 hours: BP (!) 147/81 (Cuff Size: Normal)   Pulse 70   Temp 98.2 F (36.8 C)   Ht 5\' 2"  (1.575 m)   Wt 188 lb (85.3 kg)   LMP 01/09/2024 Comment: BLEEDING NOW "KIND OF ONGOING" PER PT  BMI 34.39 kg/m  General:   Alert, NAD Lungs:  Clear .   Heart:  Regular rate and rhythm Abdomen:  Soft, nontender and nondistended. Neuro/Psych:  Alert and cooperative. Normal mood and affect. A and O x 3  Reviewed labs, radiology imaging, old records and pertinent past GI work up  Patient is appropriate for planned procedure(s) and anesthesia in an ambulatory setting   K. Scherry Ran , MD [phone]

## 2024-01-09 NOTE — Progress Notes (Signed)
 Called to room to assist during endoscopic procedure.  Patient ID and intended procedure confirmed with present staff. Received instructions for my participation in the procedure from the performing physician.

## 2024-01-09 NOTE — Progress Notes (Signed)
 Pt's states no medical or surgical changes since previsit or office visit.

## 2024-01-09 NOTE — Patient Instructions (Signed)
 Resume all of your previous medications today as ordered.  Read your discharge instructions.  YOU HAD AN ENDOSCOPIC PROCEDURE TODAY AT THE [facility]:   Refer to the procedure report that was given to you for any specific questions about what was found during the examination.  If the procedure report does not answer your questions, please call your gastroenterologist to clarify.  If you requested that your care partner not be given the details of your procedure findings, then the procedure report has been included in a sealed envelope for you to review at your convenience later.  YOU SHOULD EXPECT: Some feelings of bloating in the abdomen. Passage of more gas than usual.  Walking can help get rid of the air that was put into your GI tract during the procedure and reduce the bloating. If you had a lower endoscopy (such as a colonoscopy or flexible sigmoidoscopy) you may notice spotting of blood in your stool or on the toilet paper. If you underwent a bowel prep for your procedure, you may not have a normal bowel movement for a few days.  Please Note:  You might notice some irritation and congestion in your nose or some drainage.  This is from the oxygen used during your procedure.  There is no need for concern and it should clear up in a day or so.  SYMPTOMS TO REPORT IMMEDIATELY:  Following lower endoscopy (colonoscopy or flexible sigmoidoscopy):  Excessive amounts of blood in the stool  Significant tenderness or worsening of abdominal pains  Swelling of the abdomen that is new, acute  Fever of 100F or higher  For urgent or emergent issues, a gastroenterologist can be reached at any hour by calling [phone]. Do not use MyChart messaging for urgent concerns.    DIET:  We do recommend a small meal at first, but then you may proceed to your regular diet.  Drink plenty of fluids but you should avoid alcoholic beverages for 24 hours. Try to eat a high fiber diet,and drink plenty  of water.  ACTIVITY:  You should plan to take it easy for the rest of today and you should NOT DRIVE or use heavy machinery until tomorrow (because of the sedation medicines used during the test).    FOLLOW UP: Our staff will call the number listed on your records the next business day following your procedure.  We will call around 7:15- 8:00 am to check on you and address any questions or concerns that you may have regarding the information given to you following your procedure. If we do not reach you, we will leave a message.     If any biopsies were taken you will be contacted by phone or by letter within the next 1-3 weeks.  Please call us at [phone] if you have not heard about the biopsies in 3 weeks.    SIGNATURES/CONFIDENTIALITY: You and/or your care partner have signed paperwork which will be entered into your electronic medical record.  These signatures attest to the fact that that the information above on your After Visit Summary has been reviewed and is understood.  Full responsibility of the confidentiality of this discharge information lies with you and/or your care-partner.

## 2024-01-12 ENCOUNTER — Telehealth: Payer: Self-pay

## 2024-01-12 NOTE — Telephone Encounter (Signed)
 Left message on follow up call.

## 2024-01-13 LAB — SURGICAL PATHOLOGY

## 2024-01-17 ENCOUNTER — Other Ambulatory Visit: Payer: Self-pay | Admitting: Family

## 2024-01-17 DIAGNOSIS — J4520 Mild intermittent asthma, uncomplicated: Secondary | ICD-10-CM

## 2024-02-09 ENCOUNTER — Other Ambulatory Visit: Payer: Self-pay | Admitting: Family

## 2024-02-09 DIAGNOSIS — J4520 Mild intermittent asthma, uncomplicated: Secondary | ICD-10-CM

## 2024-03-19 ENCOUNTER — Encounter: Payer: Self-pay | Admitting: Gastroenterology

## 2024-04-16 ENCOUNTER — Other Ambulatory Visit: Payer: Self-pay | Admitting: Family

## 2024-04-16 DIAGNOSIS — Z1231 Encounter for screening mammogram for malignant neoplasm of breast: Secondary | ICD-10-CM

## 2024-04-19 ENCOUNTER — Ambulatory Visit (INDEPENDENT_AMBULATORY_CARE_PROVIDER_SITE_OTHER): Payer: Managed Care, Other (non HMO) | Admitting: Family

## 2024-04-19 ENCOUNTER — Ambulatory Visit
Admission: RE | Admit: 2024-04-19 | Discharge: 2024-04-19 | Disposition: A | Discharge status: Home / Self Care | Source: Ambulatory Visit | Attending: Family | Admitting: Family

## 2024-04-19 ENCOUNTER — Encounter: Payer: Self-pay | Admitting: Family

## 2024-04-19 VITALS — BP 154/82 | HR 78 | Temp 98.0°F | Ht 62.0 in | Wt 188.0 lb

## 2024-04-19 DIAGNOSIS — R739 Hyperglycemia, unspecified: Secondary | ICD-10-CM | POA: Diagnosis not present

## 2024-04-19 DIAGNOSIS — J4520 Mild intermittent asthma, uncomplicated: Secondary | ICD-10-CM

## 2024-04-19 DIAGNOSIS — E781 Pure hyperglyceridemia: Secondary | ICD-10-CM | POA: Diagnosis not present

## 2024-04-19 DIAGNOSIS — R030 Elevated blood-pressure reading, without diagnosis of hypertension: Secondary | ICD-10-CM

## 2024-04-19 DIAGNOSIS — Z1231 Encounter for screening mammogram for malignant neoplasm of breast: Secondary | ICD-10-CM

## 2024-04-19 DIAGNOSIS — Z5320 Procedure and treatment not carried out because of patient's decision for unspecified reasons: Secondary | ICD-10-CM

## 2024-04-19 DIAGNOSIS — Z124 Encounter for screening for malignant neoplasm of cervix: Secondary | ICD-10-CM

## 2024-04-19 NOTE — Progress Notes (Signed)
 Provider: Christean Courts FNP-C   Roshonda Sperl, Elijio Guadeloupe, NP  Patient Care Team: Keilen Kahl, Elijio Guadeloupe, NP as PCP - General (Family Medicine)  Extended Emergency Contact Information Primary Emergency Contact: [facility] Address: [address] [place]          [place] [address] Home Phone: [phone] Relation: None Secondary Emergency Contact: Sizer,Rachel Mobile Phone: [phone] Relation: Daughter  Code Status:  Full Code  Goals of care: Advanced Directive information    07/15/2023    1:20 PM  Advanced Directives  Does Patient Have a Medical Advance Directive? No  Would patient like information on creating a medical advance directive? No - Patient declined     Chief Complaint  Patient presents with   Medical Management of Chronic Issues    6 month follow up, pt stated that she has no new concerns      Discussed the use of AI scribe software for clinical note transcription with the patient, who gave verbal consent to proceed.  History of Present Illness   Sherry Wheeler is a 58 year old female who presents for a six-month follow-up.  She experiences arthritis primarily in her thumbs, describing it as painful. She takes turmeric gummies daily, which seem to help, although she still feels pain. Discuss use of stress balls for exercises to alleviate symptoms. Her husband experiences similar thumb issues and uses stress ball. No redness or swelling in her joints.  Her current medications include vitamin D  2000 units, ibuprofen 200 mg as needed, a B complex, a generic Singular daily, a multivitamin, and occasional use of triamcinolone  cream for breakouts. She also uses Advair, turmeric, and vitamin C. She has not needed albuterol  recently.  She has issues with bloating, which she describes as mild. discussed use of over-the-counter probiotics and Greek yogurt.  Previous lab work showed triglycerides of 222 one year ago and 182 six months ago. She has not consciously  changed her diet. Her glucose levels were noted to be slightly elevated previously.  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  No issues with urination, anxiety, depression, or acid reflux. No recent changes in vision, back pain, sinus pain, chest pain, palpitations, or dizziness. Occasional psoriasis breakouts, attributed more to heat than true psoriasis.   Past Medical History:  Diagnosis Date   Allergy    Asthma    History of colonoscopy    History of mammogram    Past Surgical History:  Procedure Laterality Date   BREAST BIOPSY Right 09/14/2020   COMPLEX SCLEROSING LESION WITH USUAL DUCTAL HYPERPLASIA   BREAST EXCISIONAL BIOPSY Right 12/11/2020   COMPLEX SCLEROSING LESION WITH USUAL DUCTAL HYPERPLASIA   BREAST LUMPECTOMY WITH RADIOACTIVE SEED LOCALIZATION Right 12/11/2020   Procedure: RIGHT BREAST LUMPECTOMY WITH RADIOACTIVE SEED LOCALIZATION;  Surgeon: Oza Blumenthal, MD;  Location: [facility];  Service: General;  Laterality: Right;  LMA   CESAREAN SECTION      Allergies  Allergen Reactions   Zoloft [Sertraline Hcl] Rash    Allergies as of 04/19/2024       Reactions   Zoloft [sertraline Hcl] Rash        Medication List        Accurate as of April 19, 2024 11:55 AM. If you have any questions, ask your nurse or doctor.          albuterol  108 (90 Base) MCG/ACT inhaler Commonly known  as: Ventolin  HFA INHALE TWO PUFFS BY MOUTH EVERY 4 HOURS AS NEEDED FOR WHEEZING.   ALLERGY PO Take by mouth as needed.   fluticasone -salmeterol 250-50 MCG/ACT Aepb Commonly known as: ADVAIR INHALE 1 DOSE BY MOUTH IN THE MORNING AND AT BEDTIME   ibuprofen 200 MG tablet Commonly known as: ADVIL Take 200 mg by mouth in the morning, at noon, in the evening, and at bedtime.   Midol 650 MG CR tablet Generic drug: acetaminophen  Take 650 mg by mouth every 8 (eight) hours  as needed for pain.   montelukast  10 MG tablet Commonly known as: SINGULAIR  TAKE 1 TABLET BY MOUTH AT BEDTIME   OVER THE COUNTER MEDICATION Take 1 capsule by mouth daily. Over 55 Multivitamin   OVER THE COUNTER MEDICATION Take by mouth as needed. Cold & Sinus   SUPER B COMPLEX PO Take 1 capsule by mouth daily.   triamcinolone  cream 0.1 % Commonly known as: KENALOG  Apply 1 Application topically 2 (two) times daily.   TURMERIC PO Take by mouth.   vitamin C 250 MG tablet Commonly known as: ASCORBIC ACID Take 250 mg by mouth daily.   Vitamin D3 50 MCG (2000 UT) Tabs Take 1 tablet by mouth daily.   ZINC 15 PO Take by mouth.        Review of Systems  Constitutional:  Negative for appetite change, chills, fatigue, fever and unexpected weight change.  HENT:  Negative for congestion, dental problem, ear discharge, ear pain, facial swelling, hearing loss, nosebleeds, postnasal drip, rhinorrhea, sinus pressure, sinus pain, sneezing, sore throat, tinnitus and trouble swallowing.   Eyes:  Negative for pain, discharge, redness, itching and visual disturbance.  Respiratory:  Negative for cough, chest tightness, shortness of breath and wheezing.   Cardiovascular:  Negative for chest pain, palpitations and leg swelling.  Gastrointestinal:  Negative for abdominal distention, abdominal pain, blood in stool, constipation, diarrhea, nausea and vomiting.  Endocrine: Negative for cold intolerance, heat intolerance, polydipsia, polyphagia and polyuria.  Genitourinary:  Negative for difficulty urinating, dysuria, flank pain, frequency and urgency.  Musculoskeletal:  Positive for arthralgias. Negative for back pain, gait problem, joint swelling, myalgias, neck pain and neck stiffness.       Pain on the thumbs   Skin:  Negative for color change, pallor, rash and wound.  Neurological:  Negative for dizziness, syncope, speech difficulty, weakness, light-headedness, numbness and headaches.   Hematological:  Does not bruise/bleed easily.  Psychiatric/Behavioral:  Negative for agitation, behavioral problems, confusion, hallucinations, self-injury, sleep disturbance and suicidal ideas. The patient is not nervous/anxious.     Immunization History  Administered Date(s) Administered   Influenza, Seasonal, Injecte, Preservative Fre 10/20/2023   Influenza,inj,Quad PF,6+ Mos 07/30/2022   Influenza-Unspecified 08/25/2019   PFIZER(Purple Top)SARS-COV-2 Vaccination 01/27/2020, 02/17/2020, 12/01/2020, 11/23/2021   Tdap 01/27/2023   Pertinent  Health Maintenance Due  Topic Date Due   INFLUENZA VACCINE  06/18/2024   MAMMOGRAM  03/13/2025   Colonoscopy  01/08/2031      12/11/2020    7:34 AM 05/07/2021    1:31 PM 03/12/2022    1:18 PM 07/15/2023    1:20 PM 04/19/2024   11:17 AM  Fall Risk  Falls in the past year?  0 0 0 0  Was there an injury with Fall?  0 0 0 0  Fall Risk Category Calculator  0 0 0 0  Fall Risk Category (Retired)  Low Low    (RETIRED) Patient Fall Risk Level Low fall risk Low fall risk Low  fall risk    Patient at Risk for Falls Due to  No Fall Risks No Fall Risks No Fall Risks No Fall Risks  Fall risk Follow up   Falls evaluation completed Falls evaluation completed Falls evaluation completed   Functional Status Survey:    Vitals:   04/19/24 1114  BP: (!) 154/82  Pulse: 78  Temp: 98 F (36.7 C)  TempSrc: Temporal  SpO2: 95%  Weight: 188 lb (85.3 kg)  Height: 5\' 2"  (1.575 m)   Body mass index is 34.39 kg/m. Physical Exam GENERAL: Alert, cooperative, well developed, no acute distress. HEENT: Normocephalic, normal oropharynx, moist mucous membranes, tympanic membranes normal bilaterally, nasal passages normal, pupils equal, round, and reactive to light, no sinus tenderness. NECK: Thyroid  normal. CHEST: Clear to auscultation bilaterally, no wheezes, rhonchi, or crackles. CARDIOVASCULAR: Normal heart rate and rhythm, S1 and S2 normal without  murmurs. ABDOMEN: Soft, non-tender, non-distended, without organomegaly, normal bowel sounds. EXTREMITIES: No cyanosis or edema. MUSCULOSKELETAL: Normal range of motion in hips. NEUROLOGICAL: Cranial nerves grossly intact, moves all extremities without gross motor or sensory deficit.  SKIN: No rash,no lesion or erythema   PSYCHIATRY/BEHAVIORAL: Mood stable    Labs reviewed: Recent Labs    10/20/23 1001  NA 141  K 4.0  CL 104  CO2 30  GLUCOSE 102*  BUN 14  CREATININE 0.76  CALCIUM 9.7   Recent Labs    10/20/23 1001 11/28/23 1038  AST 20 25  ALT 30* 34*  BILITOT 0.3 0.5  PROT 7.0 7.4   Recent Labs    10/20/23 1001  WBC 7.2  NEUTROABS 4,126  HGB 14.3  HCT 43.0  MCV 93.1  PLT 284   Lab Results  Component Value Date   TSH 1.79 12/15/2023   Lab Results  Component Value Date   HGBA1C 5.4 09/12/2015   Lab Results  Component Value Date   CHOL 174 10/20/2023   HDL 56 10/20/2023   LDLCALC 90 10/20/2023   TRIG 182 (H) 10/20/2023   CHOLHDL 3.1 10/20/2023    Significant Diagnostic Results in last 30 days:  No results found.  Assessment/Plan    Elevated blood pressure Blood pressure is elevated. She does not monitor blood pressure at home and reports no symptoms such as headache, dizziness, chest pain, or palpitations. - Monitor blood pressure at home for two weeks, ensuring to sit for five minutes before measurement - Record blood pressure readings and attach to MyChart for review - Return for evaluation if blood pressure remains above 140/90 mmHg  Elevated glucose Previous lab results indicated elevated glucose levels. She has not made dietary changes. Plan to recheck glucose levels to assess for prediabetes. - Order lab tests to recheck glucose levels  Hypertriglyceridemia Hypertriglyceridemia with previous improvement. Last triglyceride level was 182 mg/dL six months ago. No significant dietary changes reported. - Order lab tests to recheck triglyceride  levels  Arthritis Chronic arthritis primarily affecting both thumbs. Reports some relief with daily turmeric gummies and occasional ibuprofen. No redness, swelling, or popping sounds in the thumbs. Engages in exercises such as using stress balls and has reduced sugar intake. - Continue daily turmeric gummies - Continue ibuprofen 200 mg as needed - Encourage regular thumb exercises and use of stress balls - Monitor for any redness or swelling and report if observed  Psoriasis Occasional skin breakouts related to heat rather than psoriasis. Uses triamcinolone  cream as needed. - Continue triamcinolone  cream as needed for skin breakouts  General Health Maintenance Due  for a Pap smear and pneumonia vaccine. Declined Pap smear and has an appointment for a mammogram today. -Declined pneumonia vaccine - Complete mammogram appointment   Family/ staff Communication: Reviewed plan of care with patient verbalized understanding   Labs/tests ordered:  - CBC with Differential/Platelet - CMP with eGFR([facility]) - TSH - Hgb A1C - Lipid panel  Next Appointment : Return in about 6 months (around 10/19/2024) for medical mangement of chronic issues.Aaron Aas   Spent 30 minutes of Face to face and non-face to face with patient  >50% time spent counseling; reviewing medical record; tests; labs; documentation and developing future plan of care.   Estil Heman, NP

## 2024-04-19 NOTE — Patient Instructions (Signed)
-   Advised to check Blood pressure at home and record on log provided and notify provider if B/p > 140/90   

## 2024-04-20 LAB — HEMOGLOBIN A1C
Hgb A1c MFr Bld: 5.7 % — ABNORMAL HIGH (ref <5.7)
Mean Plasma Glucose: 117 mg/dL
eAG (mmol/L): 6.5 mmol/L

## 2024-04-20 LAB — LIPID PANEL
Cholesterol: 165 mg/dL (ref <200)
HDL: 59 mg/dL (ref >=50)
LDL Cholesterol (Calc): 83 mg/dL
Non-HDL Cholesterol (Calc): 106 mg/dL (ref <130)
Total CHOL/HDL Ratio: 2.8 (calc) (ref <5.0)
Triglycerides: 136 mg/dL (ref <150)

## 2024-04-20 LAB — CBC WITH DIFFERENTIAL/PLATELET
Absolute Lymphocytes: 1998 {cells}/uL (ref 850–3900)
Absolute Monocytes: 659 {cells}/uL (ref 200–950)
Basophils Absolute: 81 {cells}/uL (ref 0–200)
Basophils Relative: 1.1 %
Eosinophils Absolute: 252 {cells}/uL (ref 15–500)
Eosinophils Relative: 3.4 %
HCT: 43 % (ref 35.0–45.0)
Hemoglobin: 14.1 g/dL (ref 11.7–15.5)
MCH: 30.7 pg (ref 27.0–33.0)
MCHC: 32.8 g/dL (ref 32.0–36.0)
MCV: 93.7 fL (ref 80.0–100.0)
MPV: 9.6 fL (ref 7.5–12.5)
Monocytes Relative: 8.9 %
Neutro Abs: 4410 {cells}/uL (ref 1500–7800)
Neutrophils Relative %: 59.6 %
Platelets: 298 10*3/uL (ref 140–400)
RBC: 4.59 10*6/uL (ref 3.80–5.10)
RDW: 13.3 % (ref 11.0–15.0)
Total Lymphocyte: 27 %
WBC: 7.4 10*3/uL (ref 3.8–10.8)

## 2024-04-20 LAB — COMPLETE METABOLIC PANEL WITHOUT GFR
AG Ratio: 1.7 (calc) (ref 1.0–2.5)
ALT: 27 U/L (ref 6–29)
AST: 20 U/L (ref 10–35)
Albumin: 4.7 g/dL (ref 3.6–5.1)
Alkaline phosphatase (APISO): 66 U/L (ref 37–153)
BUN: 16 mg/dL (ref 7–25)
CO2: 28 mmol/L (ref 20–32)
Calcium: 10.1 mg/dL (ref 8.6–10.4)
Chloride: 105 mmol/L (ref 98–110)
Creat: 0.84 mg/dL (ref 0.50–1.03)
Globulin: 2.7 g/dL (ref 1.9–3.7)
Glucose, Bld: 99 mg/dL (ref 65–99)
Potassium: 3.7 mmol/L (ref 3.5–5.3)
Sodium: 141 mmol/L (ref 135–146)
Total Bilirubin: 0.4 mg/dL (ref 0.2–1.2)
Total Protein: 7.4 g/dL (ref 6.1–8.1)

## 2024-04-20 LAB — TSH: TSH: 0.71 m[IU]/L (ref 0.40–4.50)

## 2024-04-22 ENCOUNTER — Ambulatory Visit: Payer: Self-pay | Admitting: Family

## 2024-05-17 ENCOUNTER — Telehealth: Payer: Self-pay | Admitting: *Deleted

## 2024-05-17 NOTE — Telephone Encounter (Signed)
 Tried calling patient to schedule an appointment to discuss medication change per Monina, NP  [facility] to return call.

## 2024-05-17 NOTE — Telephone Encounter (Signed)
 Patient needs an office  visit to discuss medication change.

## 2024-05-17 NOTE — Telephone Encounter (Signed)
 Patient called and stated that she has already spoken to [facility] several times regarding this and would rather for Dinah to review message and answer.   Informed patient that Roxan was out of office and patient stated that she would wait for Dinah's response.

## 2024-05-17 NOTE — Telephone Encounter (Signed)
 Copied from CRM [id number]. Topic: Clinical - Medication Question >> May 17, 2024 10:10 AM Cherylann RAMAN wrote: Reason for CRM: Patient is requesting a weight loss medication to be prescribed as an alternative to the [facility] that was initially prescribed. Patient would like to try the ZepBound  medication. Please contact patient for more information at [phone].

## 2024-05-24 NOTE — Telephone Encounter (Signed)
 Patient informed that Roxan is out of office until 7/21 and that she would need an appointment for medication changes.  She stated that she will wait for Dinah to get back to make that decision.  Patient stated that she will call back to follow up after the 21st

## 2024-05-24 NOTE — Telephone Encounter (Signed)
 Copied from CRM [id number]. Topic: Clinical - Medication Question >> May 17, 2024 10:10 AM Sherry Wheeler wrote: Reason for CRM: Patient is requesting a weight loss medication to be prescribed as an alternative to the [facility] that was initially prescribed. Patient would like to try the ZepBound  medication. Please contact patient for more information at [phone]. >> May 24, 2024  1:26 PM [name] wrote: Patient calling to see if she can get her medication change to zepbound  for weigh  Loss with out an appointment  Please contact patient back at [phone].

## 2024-06-14 MED ORDER — ZEPBOUND 2.5 MG/0.5ML ~~LOC~~ SOAJ: 2.5000 mg | SUBCUTANEOUS | 0 refills | Status: AC

## 2024-06-14 NOTE — Telephone Encounter (Signed)
 Patient notified and agreed. Rx sent to pharmacy as requested. Patient will call back to schedule a follow up appointment.

## 2024-06-14 NOTE — Telephone Encounter (Signed)
 Copied from CRM [id number]. Topic: Clinical - Medication Question >> Jun 11, 2024  1:48 PM Laurier C wrote: Reason for CRM: Patient would like a call back at [phone] to confirm if the provider would be starting her on zepbound  like they discussed

## 2024-06-14 NOTE — Telephone Encounter (Addendum)
 Start on Zepbound  2.5 mg injection once a week  for Obesity then follow up in one month to evaluate.

## 2024-06-14 NOTE — Addendum Note (Signed)
 Addended by: LYNNANN COUGH A on: 06/14/2024 09:17 AM   Modules accepted: Orders

## 2024-06-18 ENCOUNTER — Telehealth: Payer: Self-pay

## 2024-06-18 NOTE — Telephone Encounter (Signed)
 Copied from CRM [id number]. Topic: Clinical - Medication Prior Auth >> Jun 18, 2024  2:04 PM Brittney F wrote: Reason for CRM:   Medication In Question: tirzepatide  (ZEPBOUND ) 2.5 MG/0.5ML Pen  Patient is calling in stating that while her pharmacy received the prescription it is not approved by her insurance. Please process the prior authorization and call the patient with a update today about next steps.  Preferred Pharmacy:   [facility] [address], [place] - [address] [place] [address] Phone: [phone]  Fax: [phone]    Callback Number:  [phone]

## 2024-06-18 NOTE — Telephone Encounter (Signed)
 PA in progress.

## 2024-07-08 ENCOUNTER — Telehealth: Payer: Self-pay

## 2024-07-08 NOTE — Telephone Encounter (Signed)
 Incoming fax received from [facility] indication a prior authorization is required for Zepbound . PA was attempted through Covermymeds and the message below populated:

## 2024-07-09 NOTE — Telephone Encounter (Signed)
 Noted

## 2024-07-09 NOTE — Telephone Encounter (Signed)
 Message routed to PCP Ngetich, Roxan BROCKS, NP . Patient has replied through [employer]

## 2024-10-18 ENCOUNTER — Encounter: Payer: Self-pay | Admitting: Family

## 2024-10-18 ENCOUNTER — Ambulatory Visit: Payer: Self-pay | Admitting: Family

## 2024-10-18 VITALS — BP 138/88 | HR 86 | Temp 98.3°F | Ht 62.0 in | Wt 187.0 lb

## 2024-10-18 DIAGNOSIS — M545 Low back pain, unspecified: Secondary | ICD-10-CM | POA: Diagnosis not present

## 2024-10-18 DIAGNOSIS — J4520 Mild intermittent asthma, uncomplicated: Secondary | ICD-10-CM | POA: Diagnosis not present

## 2024-10-18 DIAGNOSIS — E781 Pure hyperglyceridemia: Secondary | ICD-10-CM

## 2024-10-18 DIAGNOSIS — R7303 Prediabetes: Secondary | ICD-10-CM | POA: Diagnosis not present

## 2024-10-18 DIAGNOSIS — G8929 Other chronic pain: Secondary | ICD-10-CM

## 2024-10-18 MED ORDER — ALBUTEROL SULFATE HFA 108 (90 BASE) MCG/ACT IN AERS: INHALATION_SPRAY | RESPIRATORY_TRACT | 2 refills | Status: AC

## 2024-10-18 MED ORDER — FLUTICASONE-SALMETEROL 250-50 MCG/ACT IN AEPB: 1.0000 | INHALATION_SPRAY | Freq: Two times a day (BID) | RESPIRATORY_TRACT | 1 refills | Status: AC

## 2024-10-18 MED ORDER — CYCLOBENZAPRINE HCL 5 MG PO TABS: 5.0000 mg | ORAL_TABLET | Freq: Three times a day (TID) | ORAL | 1 refills | Status: AC | PRN

## 2024-10-18 NOTE — Progress Notes (Signed)
 Provider: Roxan Plough FNP-C   Bentleigh Stankus, Roxan BROCKS, NP  Patient Care Team: Jewelia Bocchino, Roxan BROCKS, NP as PCP - General (Family Medicine)  Extended Emergency Contact Information Primary Emergency Contact: [facility] Address: [address] [place]          [place] [address] Home Phone: [phone] Relation: None Secondary Emergency Contact: Bumbaugh,Rachel Mobile Phone: [phone] Relation: Daughter  Code Status:  Full Code  Goals of care: Advanced Directive information    10/18/2024    3:10 PM  Advanced Directives  Does Patient Have a Medical Advance Directive? No  Would patient like information on creating a medical advance directive? No - Patient declined     Chief Complaint  Patient presents with   Medical Management of Chronic Issues    6 Month follow up.    Discussed the use of AI scribe software for clinical note transcription with the patient, who gave verbal consent to proceed.  History of Present Illness   Sherry Wheeler is a 58 year old female with asthma who presents for a six-month follow-up and medication refills.  She requires refills for her asthma medications, including albuterol  and Advair. She is not using albuterol  frequently but needs a fresh supply as her current one is expired. She feels her asthma is well-controlled.  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 The pain starts in the lower back and can extend up to her neck, especially after working for an hour or two. She manages the pain with massages, chiropractic care, heating pads, and ibuprofen, but these have not provided significant relief. She has not had any imaging studies done on her back and has not used muscle relaxants before. No sciatic nerve involvement is noted with her back pain.  Her A1c level was 5.7 in June, up from a previous level of 5.4. She consumes a high amount of sweet tea.  She works as a  production assistant, radio, which involves lifting and carrying trays, contributing to her back pain. She has a daughter who was delivered via C-section, and she had a spinal block during delivery.  She experiences alternating dry and watery eyes, and her sinuses feel full, especially during this time of year. She has seasonal allergies. She has a known allergy to sertraline (Zoloft).   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 If you have any questions, ask your nurse or doctor.          albuterol  108 (90 Base) MCG/ACT inhaler Commonly known as: Ventolin  HFA  INHALE TWO PUFFS BY MOUTH EVERY 4 HOURS AS NEEDED FOR WHEEZING. What changed:  when to take this reasons to take this   ALLERGY PO Take by mouth as needed.   cyclobenzaprine 5 MG tablet Commonly known as: FLEXERIL Take 1 tablet (5 mg total) by mouth 3 (three) times daily as needed for muscle spasms. Started by: Nevia Henkin C Shiann Kam   fluticasone -salmeterol 250-50 MCG/ACT Aepb Commonly known as: ADVAIR Inhale 1 puff into the lungs in the morning and at bedtime. What changed: See the new instructions. Changed by: Leshonda Galambos C Baylor Cortez    ibuprofen 200 MG tablet Commonly known as: ADVIL Take 200 mg by mouth in the morning, at noon, in the evening, and at bedtime. What changed:  when to take this reasons to take this   Midol 650 MG CR tablet Generic drug: acetaminophen  Take 650 mg by mouth every 8 (eight) hours as needed for pain.   montelukast  10 MG tablet Commonly known as: SINGULAIR  TAKE 1 TABLET BY MOUTH AT BEDTIME   OVER THE COUNTER MEDICATION Take 1 capsule by mouth daily. Over 55 Multivitamin   OVER THE COUNTER MEDICATION Take by mouth as needed. Cold & Sinus   SUPER B COMPLEX PO Take 1 capsule by mouth daily.   triamcinolone  cream 0.1 % Commonly known as: KENALOG  Apply 1 Application topically 2 (two) times daily. What changed:  when to take this reasons to take this   TURMERIC PO Take by mouth.   vitamin C 250 MG tablet Commonly known as: ASCORBIC ACID Take 250 mg by mouth daily.   Vitamin D3 50 MCG (2000 UT) Tabs Take 1 tablet by mouth daily.   Zepbound  2.5 MG/0.5ML Pen Generic drug: tirzepatide  Inject 2.5 mg into the skin once a week.   ZINC 15 PO Take by mouth.        Review of Systems  Constitutional:  Negative for appetite change, chills, fatigue, fever and unexpected weight change.  HENT:  Negative for congestion, dental problem, ear discharge, ear pain, facial swelling, hearing loss, nosebleeds, postnasal drip, rhinorrhea, sinus pressure, sinus pain, sneezing, sore throat, tinnitus and trouble swallowing.   Eyes:  Negative for pain, discharge, redness, itching and visual disturbance.  Respiratory:  Negative for cough, chest tightness, shortness of breath and wheezing.   Cardiovascular:  Negative for chest pain, palpitations and leg swelling.  Gastrointestinal:  Negative for abdominal distention, abdominal pain, blood in stool, constipation, diarrhea, nausea and vomiting.  Endocrine: Negative for cold intolerance, heat intolerance, polydipsia, polyphagia and polyuria.   Genitourinary:  Negative for difficulty urinating, dysuria, flank pain, frequency and urgency.  Musculoskeletal:  Negative for arthralgias, back pain, gait problem, joint swelling, myalgias, neck pain and neck stiffness.  Skin:  Negative for color change, pallor, rash and wound.  Neurological:  Negative for dizziness, syncope, speech difficulty, weakness, light-headedness, numbness and headaches.  Hematological:  Does not bruise/bleed easily.  Psychiatric/Behavioral:  Negative for agitation, behavioral problems, confusion, hallucinations, self-injury, sleep disturbance and suicidal ideas. The patient is not nervous/anxious.     Immunization History  Administered Date(s) Administered   Influenza, Seasonal, Injecte, Preservative Fre 10/20/2023   Influenza,inj,Quad PF,6+ Mos 07/30/2022   Influenza-Unspecified 08/25/2019   PFIZER(Purple Top)SARS-COV-2 Vaccination 01/27/2020, 02/17/2020, 12/01/2020, 11/23/2021   Tdap 01/27/2023   Pertinent  Health Maintenance Due  Topic Date Due   Influenza Vaccine  11/30/2024 (Originally 06/18/2024)   Mammogram  04/19/2026   Colonoscopy  01/08/2031      05/07/2021    1:31 PM 03/12/2022  1:18 PM 07/15/2023    1:20 PM 04/19/2024   11:17 AM 10/18/2024    3:10 PM  Fall Risk  Falls in the past year? 0 0 0 0 0  Was there an injury with Fall? 0  0  0  0  0  Fall Risk Category Calculator 0 0 0 0 0  Fall Risk Category (Retired) Low  Low      (RETIRED) Patient Fall Risk Level Low fall risk  Low fall risk      Patient at Risk for Falls Due to No Fall Risks No Fall Risks No Fall Risks No Fall Risks No Fall Risks  Fall risk Follow up  Falls evaluation completed  Falls evaluation completed Falls evaluation completed Falls evaluation completed     Data saved with a previous flowsheet row definition   Functional Status Survey:    Vitals:   10/18/24 1515  BP: 138/88  Pulse: 86  Temp: 98.3 F (36.8 C)  SpO2: 97%  Weight: 187 lb (84.8 kg)  Height: 5' 2  (1.575 m)   Body mass index is 34.2 kg/m. Physical Exam  GENERAL: Alert, cooperative, well developed, no acute distress HEENT: Normocephalic, normal oropharynx, moist mucous membranes, ears normal bilaterally, nose without swelling, pupils equal, round, and reactive to light, no sinus tenderness NECK: Thyroid  normal CHEST: Clear to auscultation bilaterally, no wheezes, rhonchi, or crackles, no back tenderness CARDIOVASCULAR: Normal heart rate and rhythm, S1 and S2 normal without murmurs ABDOMEN: Soft, non-tender, non-distended, without organomegaly, normal bowel sounds, mild bladder discomfort, liver normal EXTREMITIES: No cyanosis or edema MUSCULOSKELETAL: Left leg normal range of motion, right leg normal range of motion NEUROLOGICAL: Cranial nerves grossly intact, moves all extremities without gross motor or sensory deficit  SKIN: No rash,no lesion or erythema   PSYCHIATRY/BEHAVIORAL: Mood stable   Labs reviewed: Recent Labs    10/20/23 1001 04/19/24 1148  NA 141 141  K 4.0 3.7  CL 104 105  CO2 30 28  GLUCOSE 102* 99  BUN 14 16  CREATININE 0.76 0.84  CALCIUM 9.7 10.1   Recent Labs    10/20/23 1001 11/28/23 1038 04/19/24 1148  AST 20 25 20   ALT 30* 34* 27  BILITOT 0.3 0.5 0.4  PROT 7.0 7.4 7.4   Recent Labs    10/20/23 1001 04/19/24 1148  WBC 7.2 7.4  NEUTROABS 4,126 4,410  HGB 14.3 14.1  HCT 43.0 43.0  MCV 93.1 93.7  PLT 284 298   Lab Results  Component Value Date   TSH 0.71 04/19/2024   Lab Results  Component Value Date   HGBA1C 5.7 (H) 04/19/2024   Lab Results  Component Value Date   CHOL 165 04/19/2024   HDL 59 04/19/2024   LDLCALC 83 04/19/2024   TRIG 136 04/19/2024   CHOLHDL 2.8 04/19/2024    Significant Diagnostic Results in last 30 days:  No results found.  Assessment/Plan  Adult Wellness Visit Routine adult wellness visit with no significant changes since last visit. Discussed general health maintenance, including vaccinations and  screenings. - Scheduled lab work for A1c, CBC, thyroid , and cholesterol - Ensured vaccinations are up to date, including flu, COVID, and shingles vaccines  Mild intermittent asthma Asthma is well-controlled with minimal use of albuterol . She acknowledges the need for a refill due to expiration. - Refilled albuterol  inhaler - Refilled Advair inhaler  Low back pain Chronic low back pain, likely muscular in nature, exacerbated by work activities. No sciatic nerve involvement. No prior imaging  or use of muscle relaxants. Discussed potential benefits of muscle relaxants and advised caution due to drowsiness. - Prescribed Flexeril 5 mg as needed, with option to increase to 10 mg if necessary - Advised caution with Flexeril due to potential drowsiness  Prediabetes A1c increased to 5.7, indicating prediabetes. Discussed dietary modifications to reduce carbohydrate intake and increase vegetables. Emphasized the importance of monitoring sweet tea consumption. - Advised dietary modifications to reduce carbohydrate intake and increase vegetables - Scheduled lab work to recheck A1c  Pure hyperglyceridemia Discussed dietary modifications to manage hyperglyceridemia, including reducing cheese intake to prevent elevated cholesterol levels. - Advised dietary modifications to manage hyperglyceridemia   Family/ staff Communication: Reviewed plan of care with patient verbalized understanding   Labs/tests ordered:  - CBC with Differential/Platelet - CMP with eGFR([facility]) - TSH - Hgb A1C - Lipid panel  Next Appointment : Return in about 6 months (around 04/18/2025) for medical mangement of chronic issues., Fasting labs this week.   Spent 30 minutes of Face to face and non-face to face with patient  >50% time spent counseling; reviewing medical record; tests; labs; documentation and developing future plan of care.   Roxan JAYSON Plough, NP

## 2024-10-18 NOTE — Patient Instructions (Addendum)
 1.Report to local pharmacy to receive Covid,& Shingles Vaccine.

## 2024-10-22 ENCOUNTER — Other Ambulatory Visit

## 2024-10-22 ENCOUNTER — Other Ambulatory Visit: Payer: Self-pay

## 2024-10-22 DIAGNOSIS — R7303 Prediabetes: Secondary | ICD-10-CM

## 2024-10-22 DIAGNOSIS — J4520 Mild intermittent asthma, uncomplicated: Secondary | ICD-10-CM

## 2024-10-22 DIAGNOSIS — E781 Pure hyperglyceridemia: Secondary | ICD-10-CM

## 2024-10-23 LAB — CBC WITH DIFFERENTIAL/PLATELET
Absolute Lymphocytes: 2338 {cells}/uL (ref 850–3900)
Absolute Monocytes: 637 {cells}/uL (ref 200–950)
Basophils Absolute: 84 {cells}/uL (ref 0–200)
Basophils Relative: 1.2 %
Eosinophils Absolute: 315 {cells}/uL (ref 15–500)
Eosinophils Relative: 4.5 %
HCT: 43 % (ref 35.9–46.0)
Hemoglobin: 14.5 g/dL (ref 11.7–15.5)
MCH: 31 pg (ref 27.0–33.0)
MCHC: 33.7 g/dL (ref 31.6–35.4)
MCV: 91.9 fL (ref 81.4–101.7)
MPV: 9.5 fL (ref 7.5–12.5)
Monocytes Relative: 9.1 %
Neutro Abs: 3626 {cells}/uL (ref 1500–7800)
Neutrophils Relative %: 51.8 %
Platelets: 292 Thousand/uL (ref 140–400)
RBC: 4.68 Million/uL (ref 3.80–5.10)
RDW: 12.5 % (ref 11.0–15.0)
Total Lymphocyte: 33.4 %
WBC: 7 Thousand/uL (ref 3.8–10.8)

## 2024-10-23 LAB — COMPLETE METABOLIC PANEL WITHOUT GFR
AG Ratio: 1.7 (calc) (ref 1.0–2.5)
ALT: 30 U/L — ABNORMAL HIGH (ref 6–29)
AST: 21 U/L (ref 10–35)
Albumin: 4.7 g/dL (ref 3.6–5.1)
Alkaline phosphatase (APISO): 63 U/L (ref 37–153)
BUN: 12 mg/dL (ref 7–25)
CO2: 32 mmol/L (ref 20–32)
Calcium: 9.9 mg/dL (ref 8.6–10.4)
Chloride: 100 mmol/L (ref 98–110)
Creat: 0.88 mg/dL (ref 0.50–1.03)
Globulin: 2.7 g/dL (ref 1.9–3.7)
Glucose, Bld: 112 mg/dL — ABNORMAL HIGH (ref 65–99)
Potassium: 3.8 mmol/L (ref 3.5–5.3)
Sodium: 141 mmol/L (ref 135–146)
Total Bilirubin: 0.5 mg/dL (ref 0.2–1.2)
Total Protein: 7.4 g/dL (ref 6.1–8.1)

## 2024-10-23 LAB — LIPID PANEL
Cholesterol: 169 mg/dL (ref <200)
HDL: 51 mg/dL (ref >=50)
LDL Cholesterol (Calc): 95 mg/dL
Non-HDL Cholesterol (Calc): 118 mg/dL (ref <130)
Total CHOL/HDL Ratio: 3.3 (calc) (ref <5.0)
Triglycerides: 125 mg/dL (ref <150)

## 2024-10-23 LAB — HEMOGLOBIN A1C
Hgb A1c MFr Bld: 5.6 % (ref <5.7)
Mean Plasma Glucose: 114 mg/dL
eAG (mmol/L): 6.3 mmol/L

## 2024-10-23 LAB — TSH: TSH: 0.84 m[IU]/L (ref 0.40–4.50)

## 2024-10-25 ENCOUNTER — Ambulatory Visit: Payer: Self-pay | Admitting: Family

## 2024-11-05 NOTE — Progress Notes (Signed)
 Pls do not add A1C since this is not fasting lab.

## 2025-04-18 ENCOUNTER — Ambulatory Visit: Admitting: Family
